# Patient Record
Sex: Female | Born: 1970 | Race: White | Hispanic: Yes | Marital: Single | Smoking: Never smoker
Health system: Southern US, Community
[De-identification: ages and names within clinical notes are randomized; demographics above are authoritative.]

## PROBLEM LIST (undated history)

## (undated) DIAGNOSIS — E782 Mixed hyperlipidemia: Secondary | ICD-10-CM

## (undated) DIAGNOSIS — F419 Anxiety disorder, unspecified: Secondary | ICD-10-CM

## (undated) HISTORY — DX: Anxiety disorder, unspecified: F41.9

## (undated) HISTORY — DX: Mixed hyperlipidemia: E78.2

## (undated) HISTORY — PX: COLPOSCOPY: SHX161

## (undated) HISTORY — PX: CERVICAL BIOPSY  W/ LOOP ELECTRODE EXCISION: SUR135

---

## 1998-09-18 ENCOUNTER — Ambulatory Visit (HOSPITAL_COMMUNITY): Admission: RE | Admit: 1998-09-18 | Discharge: 1998-09-18 | Payer: Self-pay | Admitting: *Deleted

## 1999-01-10 ENCOUNTER — Inpatient Hospital Stay (HOSPITAL_COMMUNITY): Admission: RE | Admit: 1999-01-10 | Discharge: 1999-01-10 | Payer: Self-pay | Admitting: *Deleted

## 1999-01-16 ENCOUNTER — Encounter (HOSPITAL_COMMUNITY): Admission: RE | Admit: 1999-01-16 | Discharge: 1999-02-04 | Payer: Self-pay | Admitting: Obstetrics & Gynecology

## 1999-01-16 ENCOUNTER — Encounter: Payer: Self-pay | Admitting: Obstetrics & Gynecology

## 1999-02-02 ENCOUNTER — Inpatient Hospital Stay (HOSPITAL_COMMUNITY): Admission: AD | Admit: 1999-02-02 | Discharge: 1999-02-02 | Payer: Self-pay | Admitting: *Deleted

## 1999-02-04 ENCOUNTER — Inpatient Hospital Stay (HOSPITAL_COMMUNITY): Admission: AD | Admit: 1999-02-04 | Discharge: 1999-02-05 | Payer: Self-pay | Admitting: *Deleted

## 2003-07-04 ENCOUNTER — Ambulatory Visit (HOSPITAL_COMMUNITY): Admission: RE | Admit: 2003-07-04 | Discharge: 2003-07-04 | Payer: Self-pay | Admitting: *Deleted

## 2003-09-07 ENCOUNTER — Inpatient Hospital Stay (HOSPITAL_COMMUNITY): Admission: AD | Admit: 2003-09-07 | Discharge: 2003-09-09 | Payer: Self-pay | Admitting: Family Medicine

## 2005-04-22 ENCOUNTER — Encounter: Admission: RE | Admit: 2005-04-22 | Discharge: 2005-04-22 | Payer: Self-pay | Admitting: *Deleted

## 2010-07-28 ENCOUNTER — Other Ambulatory Visit: Payer: Self-pay | Admitting: Obstetrics & Gynecology

## 2010-07-28 ENCOUNTER — Other Ambulatory Visit: Payer: Self-pay | Admitting: Family Medicine

## 2010-07-28 DIAGNOSIS — Z1231 Encounter for screening mammogram for malignant neoplasm of breast: Secondary | ICD-10-CM

## 2010-08-12 ENCOUNTER — Ambulatory Visit (HOSPITAL_COMMUNITY): Admission: RE | Admit: 2010-08-12 | Payer: Self-pay | Source: Ambulatory Visit

## 2011-07-02 ENCOUNTER — Other Ambulatory Visit (HOSPITAL_COMMUNITY): Payer: Self-pay | Admitting: Physician Assistant

## 2011-07-02 DIAGNOSIS — Z1231 Encounter for screening mammogram for malignant neoplasm of breast: Secondary | ICD-10-CM

## 2011-07-08 ENCOUNTER — Ambulatory Visit (INDEPENDENT_AMBULATORY_CARE_PROVIDER_SITE_OTHER): Payer: Self-pay | Admitting: Obstetrics & Gynecology

## 2011-07-08 ENCOUNTER — Other Ambulatory Visit: Payer: Self-pay | Admitting: Obstetrics & Gynecology

## 2011-07-08 ENCOUNTER — Encounter: Payer: Self-pay | Admitting: Obstetrics & Gynecology

## 2011-07-08 VITALS — BP 109/67 | HR 72 | Temp 97.1°F | Ht 60.0 in | Wt 127.6 lb

## 2011-07-08 DIAGNOSIS — R102 Pelvic and perineal pain: Secondary | ICD-10-CM

## 2011-07-08 DIAGNOSIS — N946 Dysmenorrhea, unspecified: Secondary | ICD-10-CM | POA: Insufficient documentation

## 2011-07-08 NOTE — Progress Notes (Signed)
  Subjective:    Patient ID: Brittany Huber, female    DOB: Jun 27, 1970, 41 y.o.   MRN: [id number]  HPIPatient's last menstrual period was 07/02/2011. [id number] On Yasmin for the last 2 years, still has dysmenorrhea and menstrual headaches. Advil helps. Referred by [facility]. Chart states that uterus is 6-8 weeks size.  History reviewed. No pertinent past medical history. History reviewed. No pertinent past surgical history. Scheduled Meds:   Continuous Infusions:   PRN Meds:.   Current outpatient prescriptions:drospirenone-ethinyl estradiol (YAZ,GIANVI,LORYNA) 3-0.02 MG tablet, Take 1 tablet by mouth daily., Disp: , Rfl:   Review of Systems No Known Allergies History   Social History  . Marital Status: Single    Spouse Name: N/A    Number of Children: N/A  . Years of Education: N/A   Occupational History  . Not on file.   Social History Main Topics  . Smoking status: Never Smoker   . Smokeless tobacco: Never Used  . Alcohol Use: No  . Drug Use: No  . Sexually Active: Yes    Birth Control/ Protection: Pill   Other Topics Concern  . Not on file   Social History Narrative  . No narrative on file   History reviewed. No pertinent family history.     Objective:   Physical Exam  Constitutional: She appears well-developed. No distress.  Abdominal: Soft. She exhibits no distension. There is no tenderness.  Genitourinary: Vagina normal and uterus normal.       No mass   Filed Vitals:   07/08/11 1259  BP: 109/67  Pulse: 72  Temp: 97.1 F (36.2 C)  TempSrc: Oral  Height: 5' (1.524 m)  Weight: 57.879 kg (127 lb 9.6 oz)   NAD       Assessment & Plan:  Dysmenorrhea, headaches. Continuous OCP use for 4 packs consecutively. Pelvic US, RTC 4 months.  Ugochi Henzler 1:52 PM 07/08/11

## 2011-07-08 NOTE — Patient Instructions (Signed)
Dysmenorrhea (Dysmenorrhea) La causa del dolor menstrual son las contracciones de los msculos del tero durante el perodo menstrual. Los msculos del tero se contraen debido a las sustancias qumicas que se encuentran en sus paredes. La dismenorrea primaria son clicos menstruales que duran algunos das, al comienzo del perodo menstrual o poco despus. Ocurren cuando la adolescente comienza a tener sus perodos. A medida que la mujer madura, o tiene un beb, los clicos disminuyen o desaparecen. La dismenorrea secundaria comienza en etapas posteriores de la vida, dura ms y el dolor puede ser ms intenso que en el caso de la dismenorrea primaria. El dolor puede comenzar antes del perodo y durar hasta algunos das despus de su finalizacin. Este tipo de dismenorrea generalmente se debe a un problema subyacente como:  El tejido que recubre internamente el tero se desarrolla fuera del mismo, en otras reas del organismo (endometriosis).   El tejido del endometrio, que generalmente recubre el tero, se desarrolla en las paredes musculares del tero (adenomiosis).   Los vasos sanguneos de la pelvis se llenan de sangre justo antes del perodo menstrual (sndrome congestivo plvico).   Desarrollo excesivo de algunas clulas del tejido que cubre el tero o el cuello del tero (plipos del tero o del cuello).   Cada del tero (prolapso) debido al aflojamiento y distensin de los ligamentos.   Depresin.   Problemas en la vejiga como infeccin o inflamacin.   Problemas intestinales, como tumores o sndrome de colon irritable.   Cncer en los rganos femeninos o en la vejiga.   tero excesivamente inclinado.   Apertura del cuello del tero muy estrecha o cerrada.   Tumores no cancerosos del tero (fibromas).   Enfermedad plvica inflamatoria (EPI).   Cicatrices en la pelvis (adherencias) por cirugas previas.   Quiste ovrico.   Uso del dispositivo intrauterino (DIU) para el  control de la natalidad.  CAUSAS La causa del dolor menstrual con frecuencia es desconocida. SNTOMAS  Clicos o dolor punzante en la zona inferior del abdomen.   En algunos casos, tambin se experimentan cefaleas.   Dolor en la zona inferior de la espalda.   Ganas de vomitar (nuseas ) o vmitos.   Diarrea.   Sudoracin o mareos.  DIAGNSTICO El diagnstico se basa en la historia personal, los sntomas, el examen fsico, pruebas diagnsticas o procedimientos. Las pruebas diagnsticas o procedimientos pueden incluir:  Anlisis de sangre.   Ecografa.   Un estudio de las capas que cubren el tero (dilatacin y curetaje, D y C).   Examen de la zona interna del abdomen o la pelvis con un laparoscopio.   Radiografas.   Tomografa computada.   Resonancia magntica.   Un examen del interior de la vejiga con un citoscopio.   Examen del interior del intestino o el estmago con un colonoscopio o un gastroscopio.  TRATAMIENTO El tratamiento depende de la causa de la dismenorrea. El tratamiento puede incluir:  Analgsicos prescriptos por el mdico.   Pldoras anticonceptivas.   Terapia de hormonas.   Medicamentos antiinflamatorios no esteroides. Ayudan a detener la produccin de prostaglandinas.   Un dispositivo intrauterino que contenga progesterona.   Acupuntura.   Ciruga para extirpar adherencias, endometriosis, quistes ovricos o fibromas.   Remocin del tero (histerectoma).   Inyecciones de progesterona para interrumpir el periodo menstrual.   Diseccin de los nervios del sacro que van a los rganos femeninos (neurectoma presacra).   Corriente elctrica a los nervios sacros (estimulacin del nervio sacro).   Antidepresivos.     Terapia psiquitrica, psicoterapia o terapia grupal.   La actividad fsica y la fisioterapia.   La meditacin y el yoga.  INSTRUCCIONES PARA EL CUIDADO DOMICILIARIO  Utilice los medicamentos de venta libre o de prescripcin  para Chief Technology Officer, el malestar o la [place], segn se lo indique el profesional que lo asiste.   Coloque una almohadilla elctrica o una botella con agua caliente en la zona inferior del abdomen. No duerma con la almohadilla trmica.   Los ejercicios aerbicos, caminatas, natacin, Scientist, research (physical sciences) ayudan a [employer].   Masajear la zona inferior de la espalda o el abdomen puede ser de [place].   Dejar de fumar.   Evite el alcohol y la cafena.   El yoga, la meditacin o la acupuntura pueden ser de [place].  SOLICITE ATENCIN MDICA SI:  El dolor no mejora con los medicamentos.   Tiene dolor durante las [employer].  SOLICITE ATENCIN MDICA DE INMEDIATO SI:  El dolor aumenta y no consigue aliviarlo con los medicamentos.   Tiene fiebre.   Comienza a sentir nuseas o vomita durante el perodo y no puede controlarlo con medicamentos.   Presenta una hemorragia vaginal anormal durante el perodo.   Se desmaya.  EST SEGURO QUE:  Comprende las instrucciones para el alta mdica.   Controlar su enfermedad.   Solicitar atencin mdica de inmediato segn las indicaciones.  Document Released: 01/21/2005 Document Revised: 04/02/2011 [facility] Patient Information 2012 [place], [place]. Take your birth control pill without the last 7 pills for the next 4 packs.   Marcie Bal MD

## 2011-07-10 ENCOUNTER — Ambulatory Visit (HOSPITAL_COMMUNITY)
Admission: RE | Admit: 2011-07-10 | Discharge: 2011-07-10 | Disposition: A | Discharge status: Home / Self Care | Payer: Self-pay | Source: Ambulatory Visit | Attending: Obstetrics & Gynecology | Admitting: Obstetrics & Gynecology

## 2011-07-10 DIAGNOSIS — R102 Pelvic and perineal pain: Secondary | ICD-10-CM

## 2011-07-10 DIAGNOSIS — N949 Unspecified condition associated with female genital organs and menstrual cycle: Secondary | ICD-10-CM | POA: Insufficient documentation

## 2011-07-10 DIAGNOSIS — N946 Dysmenorrhea, unspecified: Secondary | ICD-10-CM | POA: Insufficient documentation

## 2011-07-24 ENCOUNTER — Ambulatory Visit (HOSPITAL_COMMUNITY)
Admission: RE | Admit: 2011-07-24 | Discharge: 2011-07-24 | Disposition: A | Discharge status: Home / Self Care | Payer: Self-pay | Source: Ambulatory Visit | Attending: Physician Assistant | Admitting: Physician Assistant

## 2011-07-24 DIAGNOSIS — Z1231 Encounter for screening mammogram for malignant neoplasm of breast: Secondary | ICD-10-CM

## 2011-07-28 ENCOUNTER — Other Ambulatory Visit: Payer: Self-pay | Admitting: Physician Assistant

## 2011-07-28 DIAGNOSIS — R928 Other abnormal and inconclusive findings on diagnostic imaging of breast: Secondary | ICD-10-CM

## 2011-08-25 ENCOUNTER — Ambulatory Visit (INDEPENDENT_AMBULATORY_CARE_PROVIDER_SITE_OTHER): Payer: Self-pay | Admitting: *Deleted

## 2011-08-25 VITALS — BP 120/68 | HR 61 | Temp 97.5°F | Ht 60.5 in | Wt 125.4 lb

## 2011-08-25 DIAGNOSIS — Z01419 Encounter for gynecological examination (general) (routine) without abnormal findings: Secondary | ICD-10-CM

## 2011-08-25 NOTE — Progress Notes (Signed)
Referred from the [facility] for additional imaging of the left breast. Screening mammogram 07/24/11.  Pap Smear:    Pap smear completed today. Patients last Pap smear was 11/05/09 at the [facility] and normal. Patient has a history of an abnormal Pap smear in 2007 that required a colposcopy. No Pap smear results in EPIC. Will have result scanned into EPIC.  Physical exam: Breasts Breasts symmetrical. No skin abnormalities bilateral breasts. No nipple retraction bilateral breasts. No nipple discharge bilateral breasts. No lymphadenopathy. No lumps palpated bilateral breasts. Patient complained of left breast pain that hurts all the time. Patient rated pain at a 8 out of 10. Patient referred to the [facility] for left breast diagnostic mammogram and possible left breast ultrasound. Appointment scheduled for Thursday, Aug 27, 2011 at 1030.                Pelvic/Bimanual Ext Genitalia No lesions, no swelling and no discharge observed on external genitalia.         Vagina Vagina pink and normal texture. No lesions and menstrual cycle bleeding observed in vagina.          Cervix Cervix is present. Cervix pink and of normal texture. Menstrual cycle bleeding observed on cervix.     Uterus Uterus is present and palpable. Uterus in normal position and normal size.        Adnexae Bilateral ovaries present and palpable. No tenderness on palpation.          Rectovaginal No rectal exam completed today since patient had no rectal complaints. No skin abnormalities observed on exam.

## 2011-08-25 NOTE — Patient Instructions (Addendum)
Taught patient how to perform BSE and gave educational materials to take home. Let her know [facility] will cover Pap smears every 3 years unless has a history of abnormal Pap smears. Patient referred to the [facility] for left breast diagnostic mammogram and possible left breast ultrasound. Appointment scheduled for Thursday, Aug 27, 2011 at 1030. Patient aware of appointment and will be there. Let patient know will follow up with her within the next couple weeks with results. Patient verbalized understanding.

## 2011-08-27 ENCOUNTER — Other Ambulatory Visit: Payer: Self-pay

## 2011-09-03 ENCOUNTER — Ambulatory Visit
Admission: RE | Admit: 2011-09-03 | Discharge: 2011-09-03 | Disposition: A | Discharge status: Home / Self Care | Payer: No Typology Code available for payment source | Source: Ambulatory Visit | Attending: Physician Assistant | Admitting: Physician Assistant

## 2011-09-03 DIAGNOSIS — R928 Other abnormal and inconclusive findings on diagnostic imaging of breast: Secondary | ICD-10-CM

## 2011-09-08 ENCOUNTER — Telehealth: Payer: Self-pay

## 2011-09-08 NOTE — Telephone Encounter (Signed)
Called pt with Spanish interpreter, Genella Rife, and informed pt of normal pap results.  She was also aware of the negative breast diagnostic mammo and that they recommended routine screening yearly back on schedule.  Pt had no further questions.

## 2012-06-16 ENCOUNTER — Other Ambulatory Visit: Payer: Self-pay | Admitting: Physician Assistant

## 2012-10-07 ENCOUNTER — Other Ambulatory Visit (HOSPITAL_COMMUNITY): Payer: Self-pay | Admitting: Nurse Practitioner

## 2012-10-07 DIAGNOSIS — Z1231 Encounter for screening mammogram for malignant neoplasm of breast: Secondary | ICD-10-CM

## 2012-10-21 ENCOUNTER — Ambulatory Visit (HOSPITAL_COMMUNITY)
Admission: RE | Admit: 2012-10-21 | Discharge: 2012-10-21 | Disposition: A | Discharge status: Home / Self Care | Payer: Self-pay | Source: Ambulatory Visit | Attending: Nurse Practitioner | Admitting: Nurse Practitioner

## 2012-10-21 DIAGNOSIS — Z1231 Encounter for screening mammogram for malignant neoplasm of breast: Secondary | ICD-10-CM

## 2013-04-27 IMAGING — US US PELVIS COMPLETE
1 series · 13 of 25 positions shown · non-contrast
Comparison: None.

CLINICAL DATA: Pelvic pain.  Dysmenorrhea with headaches.  On
Osmani birth control. LMP 07/02/2011

TRANSABDOMINAL AND TRANSVAGINAL ULTRASOUND OF PELVIS
TECHNIQUE: Both transabdominal and transvaginal ultrasound
examinations of the pelvis were performed. Transabdominal technique
was performed for global imaging of the pelvis including uterus,
ovaries, adnexal regions, and pelvic cul-de-sac.

[Series 1: us transvaginal non-ob · 13 of 61 slices shown]
[im 1/61]
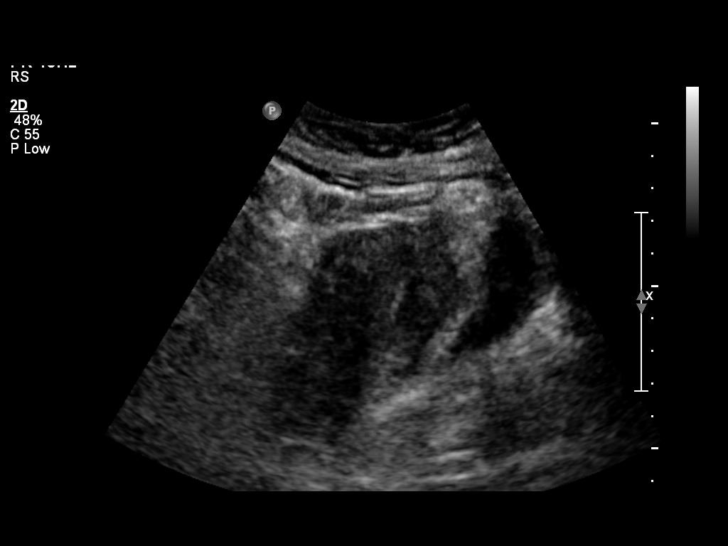
[im 6/61]
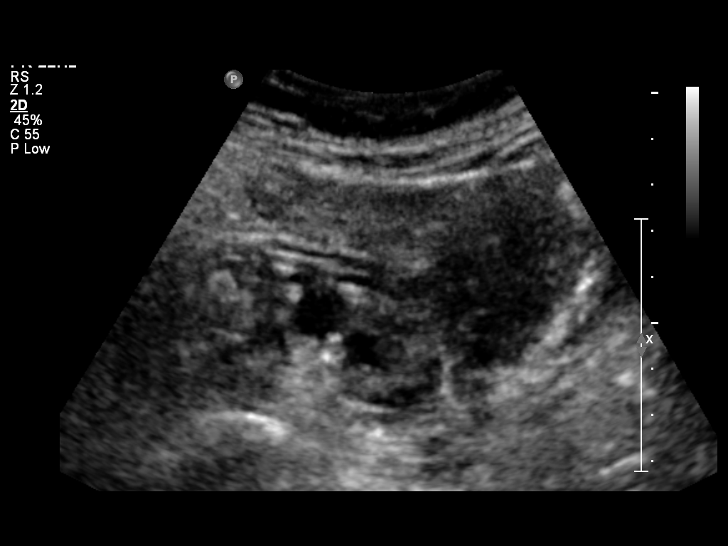
[im 11/61]
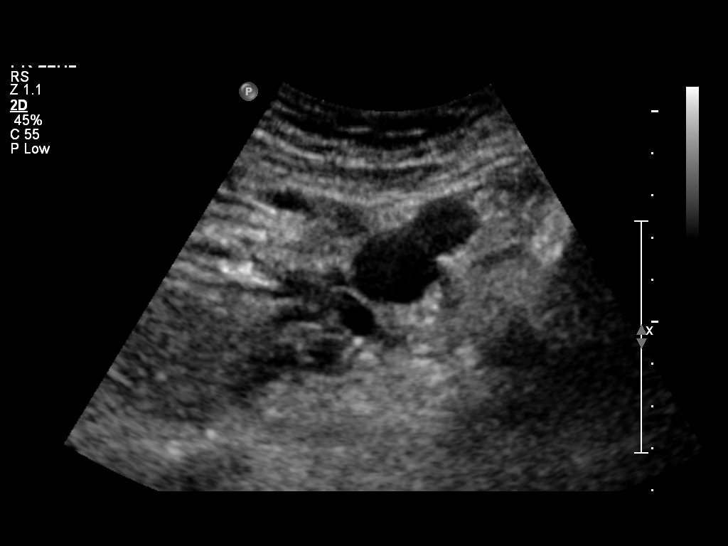
[im 16/61]
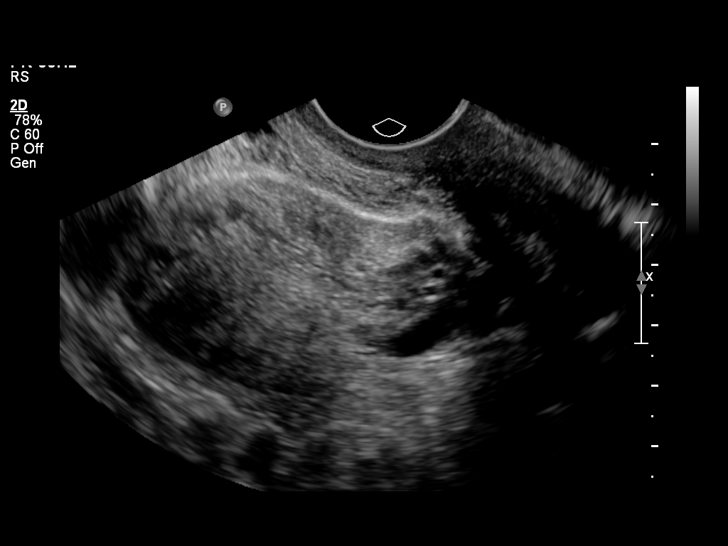
[im 21/61]
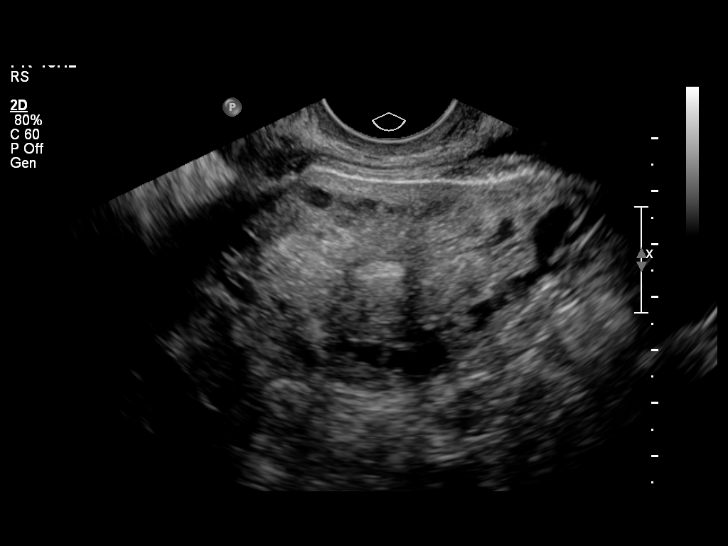
[im 26/61]
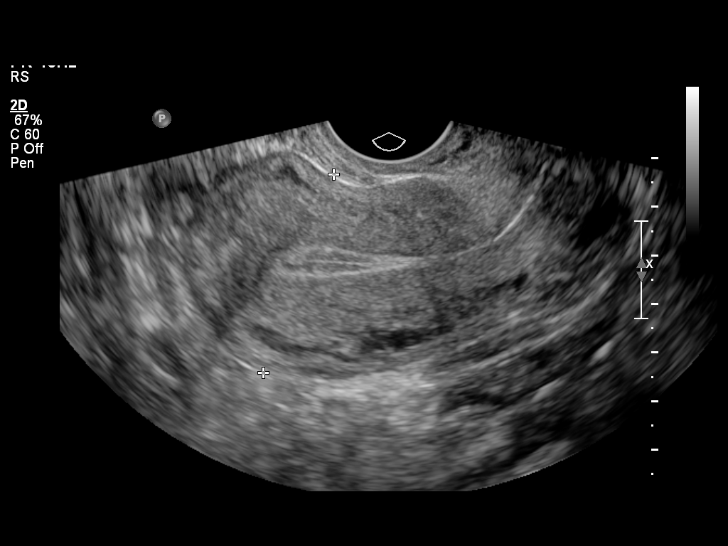
[im 31/61]
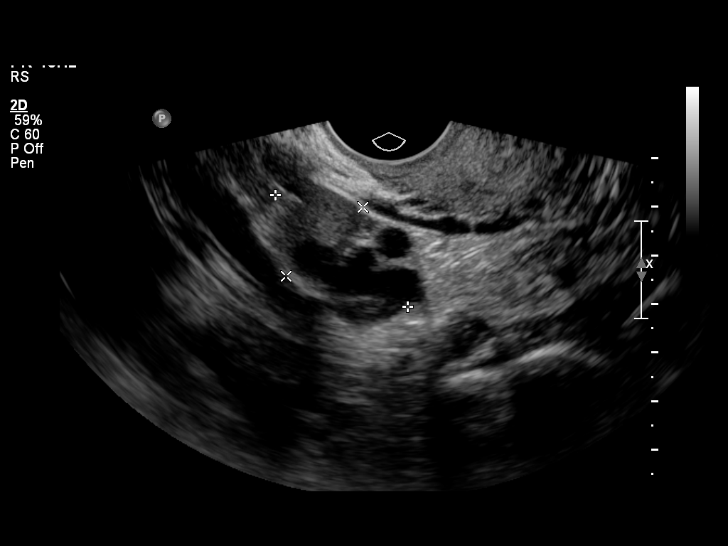
[im 36/61]
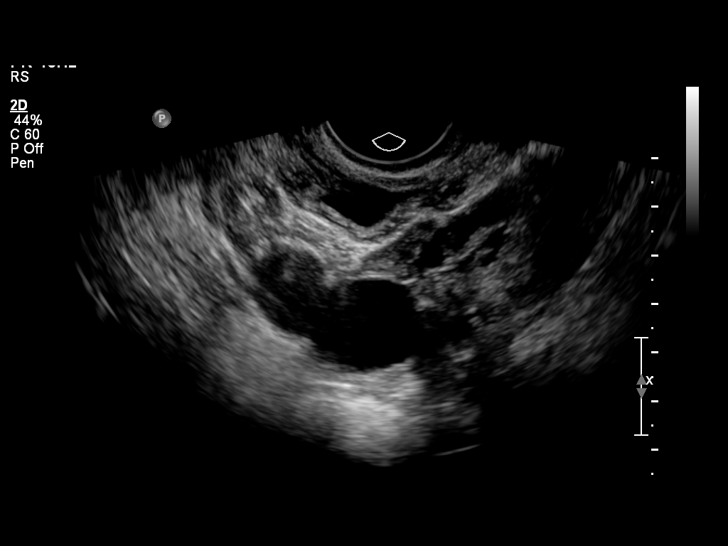
[im 41/61]
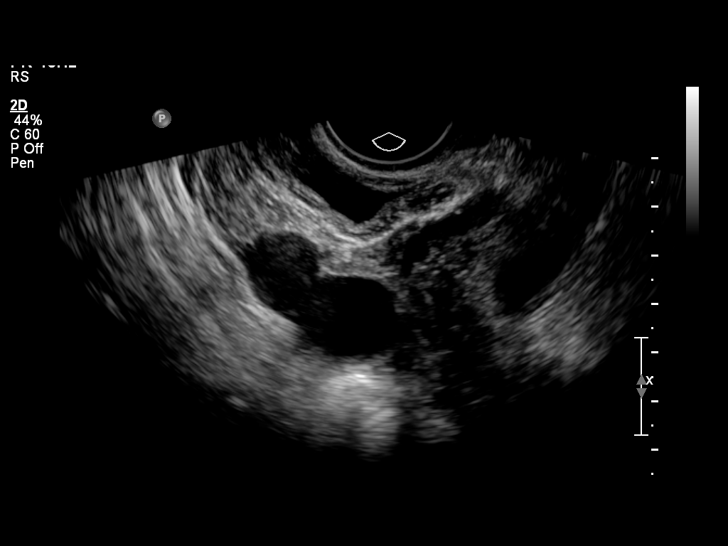
[im 46/61]
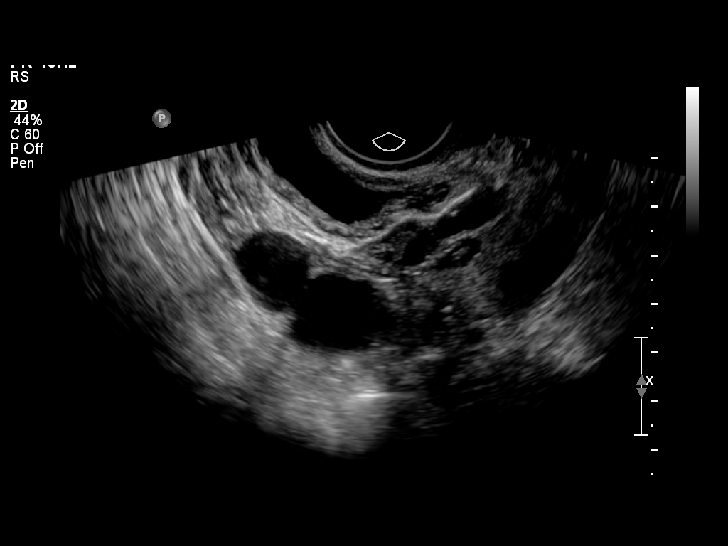
[im 51/61]
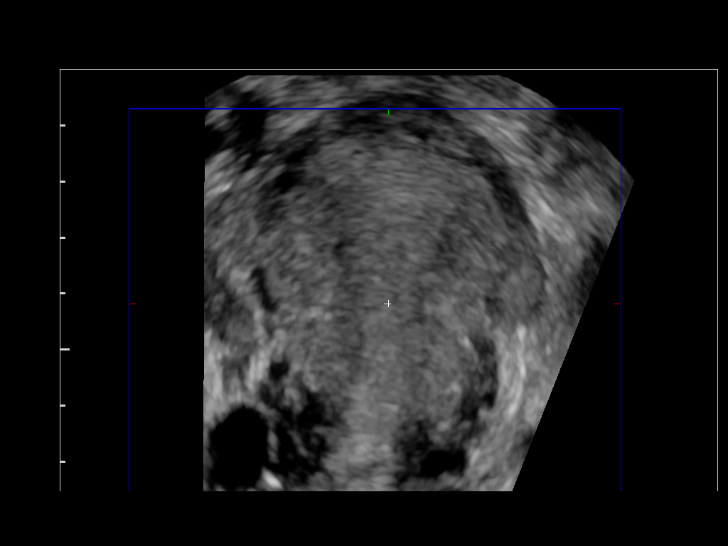
[im 56/61]
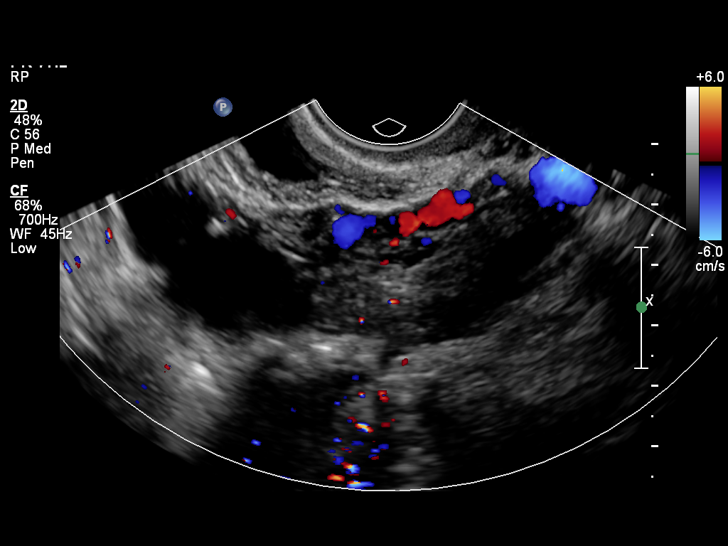
[im 61/61]
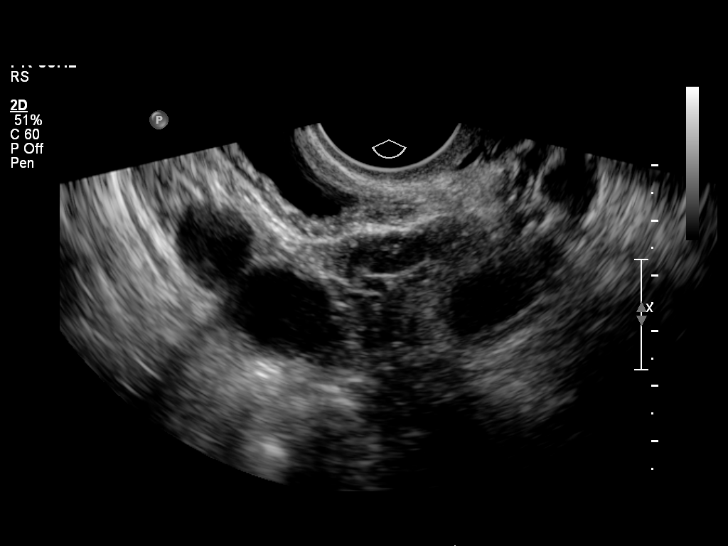

[13 of 25 positions shown; findings below may reference images not displayed]

It was necessary to proceed with endovaginal exam following the
transabdominal exam to visualize the myometrium, endometrium and
adnexa.
FINDINGS: Uterus: Demonstrates a sagittal length of 9.1 cm, depth of 4.3 cm
and width of 6.1 cm.  A homogeneous myometrium is seen

Endometrium: Has an early tri- layered appearance with an AP width
of 7.0 mm.  No areas of focal thickening or heterogeneity are seen
and this would correlate with an early proliferative endometrial
stripe and correspond with the patient's given LMP of 07/02/2011.

Right ovary:  Has a normal appearance measuring 2.4 x 3.6 x 2.1 cm

Left ovary: Has a normal appearance measuring 2.3 x 2.6 by 3.6 cm

Other findings: No pelvic fluid or separate adnexal masses are
seen.
IMPRESSION: Normal early proliferative pelvic ultrasound.

## 2013-07-25 ENCOUNTER — Emergency Department (HOSPITAL_COMMUNITY)
Admission: EM | Admit: 2013-07-25 | Discharge: 2013-07-25 | Disposition: A | Discharge status: Home / Self Care | Payer: Self-pay | Attending: Emergency Medicine | Admitting: Emergency Medicine

## 2013-07-25 ENCOUNTER — Encounter (HOSPITAL_COMMUNITY): Payer: Self-pay | Admitting: Emergency Medicine

## 2013-07-25 ENCOUNTER — Emergency Department (HOSPITAL_COMMUNITY): Payer: Self-pay

## 2013-07-25 DIAGNOSIS — Z79899 Other long term (current) drug therapy: Secondary | ICD-10-CM | POA: Insufficient documentation

## 2013-07-25 DIAGNOSIS — G43909 Migraine, unspecified, not intractable, without status migrainosus: Secondary | ICD-10-CM | POA: Insufficient documentation

## 2013-07-25 DIAGNOSIS — IMO0002 Reserved for concepts with insufficient information to code with codable children: Secondary | ICD-10-CM | POA: Insufficient documentation

## 2013-07-25 MED ORDER — PROCHLORPERAZINE EDISYLATE 5 MG/ML IJ SOLN
10.0000 mg | Freq: Once | INTRAMUSCULAR | Status: AC
  Administered 2013-07-25: 10 mg via INTRAVENOUS
  Filled 2013-07-25: qty 2

## 2013-07-25 MED ORDER — KETOROLAC TROMETHAMINE 30 MG/ML IJ SOLN
30.0000 mg | Freq: Once | INTRAMUSCULAR | Status: AC
  Administered 2013-07-25: 30 mg via INTRAVENOUS
  Filled 2013-07-25: qty 1

## 2013-07-25 MED ORDER — DIPHENHYDRAMINE HCL 50 MG/ML IJ SOLN
25.0000 mg | Freq: Once | INTRAMUSCULAR | Status: AC
  Administered 2013-07-25: 25 mg via INTRAVENOUS
  Filled 2013-07-25: qty 1

## 2013-07-25 NOTE — ED Notes (Signed)
Pt sts she is also having a throbbing feeling to right side of neck when she gets a HA.

## 2013-07-25 NOTE — ED Provider Notes (Signed)
CSN: [id number]     Arrival date & time 07/25/13  1619 History   First MD Initiated Contact with Patient 07/25/13 1958     Chief Complaint  Patient presents with  . Headache    Patient is a 43 y.o. female presenting with headaches.  Headache Pain location:  L temporal and R temporal Quality:  Dull Radiates to:  Does not radiate Severity currently:  1/10 Severity at highest:  6/10 Onset quality:  Gradual Duration:  2 weeks Timing:  Intermittent Progression:  Improving Chronicity:  Recurrent Similar to prior headaches: yes   Context comment:  Menstruation Relieved by:  Nothing Worsened by:  Light and sound Ineffective treatments:  None tried Associated symptoms: photophobia   Associated symptoms: no abdominal pain, no blurred vision, no congestion, no cough, no diarrhea, no pain, no facial pain, no fever, no loss of balance, no nausea, no neck pain, no neck stiffness, no numbness, no paresthesias, no sore throat, no syncope, no tingling, no URI, no visual change, no vomiting and no weakness    She states that she has a long history of migraine headaches. She states that she always gets headaches around the time of her menstrual period onset. She states that this headache started with her menstrual period however unlike usual it did not improve over the course of first one to 2 days. Says that time she's had daily headaches. Her headaches have been moderate in severity except for on the first day, when it was severe.  It was gradual in onset, and otherwise identical to prior headaches. Currently she states that she has almost no headache; it has been improving over the past 24 hours.  The triage nurse documented that she reported facial droop. I question the patient with Spanish interpreter via telephone and the patient denies any facial droop. She also denies any visual changes, weakness, numbness, paresthesias, difficulty with speech or gait or balance. She also denies fever, confusion,  neck pain, neck stiffness.  History reviewed. No pertinent past medical history. History reviewed. No pertinent past surgical history. Family History  Problem Relation Age of Onset  . Cancer Maternal Grandmother     uterine cancer   History  Substance Use Topics  . Smoking status: Never Smoker   . Smokeless tobacco: Never Used  . Alcohol Use: No   OB History   Grav Para Term Preterm Abortions TAB SAB Ect Mult Living   5 4 4  0 1 1    3      Review of Systems  Constitutional: Negative for fever.  HENT: Negative for congestion and sore throat.   Eyes: Positive for photophobia. Negative for blurred vision and pain.  Respiratory: Negative for cough.   Cardiovascular: Negative for syncope.  Gastrointestinal: Negative for nausea, vomiting, abdominal pain and diarrhea.  Musculoskeletal: Negative for neck pain and neck stiffness.  Neurological: Positive for headaches. Negative for numbness, paresthesias and loss of balance.      Allergies  Review of patient's allergies indicates no known allergies.  Home Medications   Current Outpatient Rx  Name  Route  Sig  Dispense  Refill  . acyclovir (ZOVIRAX) 400 MG tablet   Oral   Take 400 mg by mouth 4 (four) times daily.         Marland Kitchen FLUoxetine (PROZAC) 20 MG tablet   Oral   Take 20 mg by mouth daily.         . Homeopathic Products (EARACHE DROPS) SOLN   Otic  Place 4 drops in ear(s) daily as needed (for earache).         . pravastatin (PRAVACHOL) 20 MG tablet   Oral   Take 20 mg by mouth daily.         . predniSONE (DELTASONE) 10 MG tablet   Oral   Take 10-30 mg by mouth daily with breakfast.          BP 133/68  Pulse 68  Temp(Src) 98.8 F (37.1 C) (Oral)  Resp 14  SpO2 99% Physical Exam  Nursing note and vitals reviewed. Constitutional: She appears well-developed and well-nourished. No distress.  HENT:  Head: Normocephalic and atraumatic.  Mouth/Throat: No oropharyngeal exudate.  Eyes: Conjunctivae are  normal. Pupils are equal, round, and reactive to light. No scleral icterus.  Neck: Normal range of motion. Neck supple. No Brudzinski's sign and no Kernig's sign noted.  Cardiovascular: Normal rate, regular rhythm, normal heart sounds and intact distal pulses.  Exam reveals no gallop and no friction rub.   No murmur heard. Pulmonary/Chest: Effort normal and breath sounds normal. No respiratory distress. She has no wheezes. She has no rales. She exhibits no tenderness.  Abdominal: Soft. Bowel sounds are normal. She exhibits no distension. There is no tenderness. There is no rebound and no guarding.  Musculoskeletal: She exhibits no edema.  Neurological:  Alert, oriented X 4, GCS 15.  CN 2-12 intact.  5/5 strength in bilateral upper and lower extremities, all major muscle groups.  Normal sensation to light touch in bilateral upper and lower extremities.  Visual fields intact to confrontation.  Normal gait.  Negative Romberg.  Normal coordination.  Normal finger to nose and heel to shin.   Skin: Skin is warm and dry. No rash noted.  Psychiatric: She has a normal mood and affect.    ED Course  Procedures (including critical care time) Labs Review Labs Reviewed - No data to display Imaging Review Ct Head Wo Contrast  07/25/2013   CLINICAL DATA:  Headache for 2 weeks, history of migraines, patient describes prior stroke  EXAM: CT HEAD WITHOUT CONTRAST  TECHNIQUE: Contiguous axial images were obtained from the base of the skull through the vertex without intravenous contrast.  COMPARISON:  None  FINDINGS: No evidence of hemorrhage, infarct, or extra-axial fluid. Normal sulcation. No hydrocephalus. There is mild incidental asymmetry in the temporal horns of the ventricles. Calvarium is intact. No evidence of skull fracture. No significant inflammatory change in the sinuses that are visualized. Incidental note of a punctate parenchymal calcification right parietal lobe above the level of the ventricles,  likely not of acute clinical significance.  IMPRESSION: No acute abnormalities   Electronically Signed   By: Skipper Cliche M.D.   On: 07/25/2013 21:02     EKG Interpretation None      MDM   43 year old female with a history of migraine headaches presents complaining of a headache. Her headache is gradual in onset, has been ongoing daily for about 2 weeks, started around the time of her menstrual period, and is otherwise very similar to headaches she said in the past. It is moderate in severity at worst, currently improving, mild. She's had no fever, altered bowel status, neck pain or stiffness, or neurological symptoms. Her headache is migrainous in nature with, throbbing, temporal, with photophobia and phonophobia.  I first spoke to the patient utilizing her son as an interpreter.  He mentioned some strange, non-neurologic symptoms such as bilateral face numbness, bilateral face droop, sometimes on one  side, sometimes on the other, and arm "tingling."  I was called into another room to see an unstable patient.  Based on this conversation, I ordered I head CT.  I went back and spoke with the patient shortly thereafter with a Spanish interpreter via telephone.  Her symptoms based on this conversation are as detailed above.  Very migrainous in nature, similar to recurrent, chronic headaches.  At this time, she denied facial droop, difficulty with speech, weakness, numbness, paresthesias, difficulty with gait or balance, or other neurological symptoms.  Her neuro exam is non-focal as documented.  She is treated in the emergency department with a migraine cocktail. On reevaluation, the patient is sleeping. While over up she states that her headache is gone and she feels much better. She is discharged home with ED return precautions and is to follow up with her primary care physician to discuss her headaches.   Final diagnoses:  Migraine       Wendall Papa, MD 07/26/13 0006

## 2013-07-25 NOTE — ED Notes (Signed)
Pharmacy tech at bedside 

## 2013-07-25 NOTE — ED Notes (Addendum)
Pt c/o HA that started last week on Thursday. sts she went to PCP and was given medicine to take for HA but it isn't giving her any relief. sts initially she felt like her mouth was dropping but hasn't had it since Thursday. Pt put on prednisone and anti-anxiety meds. Was told she had an infection in her ear. Pt denies blurred vision currently but takes she had a few moments yesterday when she was having some blurred vision. Denies n/v. Nad, skin warm and dry, resp e/u. No facial droop, no slurred speech, equal hand grips. No difficulty ambulating to room.

## 2013-07-25 NOTE — ED Notes (Signed)
Pt reports headache x 2 weeks, had episodes over the past week with facial droop. No droop noted at triage, grips equal, no arm drift. bp 114/58 at triage. Denies n/v.

## 2013-07-25 NOTE — Discharge Instructions (Signed)
Cefalea migrañosa  (Migraine Headache)  Una cefalea migrañosa es un dolor intenso y punzante en uno o ambos lados de la cabeza. La migraña puede durar desde 30 minutos hasta varias horas.  CAUSAS   No siempre se conoce la causa exacta de la cefalea migrañosa. Sin embargo, pueden aparecer cuando los nervios del cerebro se irritan y liberan ciertas sustancias químicas que causan inflamación. Esto ocasiona dolor.  Existen también ciertos factores que pueden desencadenar las migrañas, como los siguientes:  · Alcohol.  · Fumar.  · Estrés.  · La menstruación  · Quesos añejados.  · Los alimentos o las bebidas que contienen nitratos, glutamato, aspartamo o tiramina.  · Falta de sueño.  · Chocolate.  · Cafeína.  · Hambre.  · Actividad física extenuante.  · Fatiga.  · Medicamentos que se usan para tratar el dolor en el pecho (nitroglicerina), píldoras anticonceptivas, estrógeno y algunos medicamentos para la hipertensión arterial.  SIGNOS Y SÍNTOMAS  · Dolor en uno o ambos lados de la cabeza.  · Dolor pulsante o punzante.  · Dolor intenso que impide realizar las actividades diarias.  · Dolor que se agrava por cualquier actividad física.  · Náuseas, vómitos o ambos.  · Mareos.  · Dolor con la exposición a las luces brillantes, a los ruidos fuertes o la actividad.  · Sensibilidad general a las luces brillantes, a los ruidos fuertes o a los olores.  Antes de sufrir una migraña, puede recibir señales de advertencia (aura). Un aura puede incluir:  · Ver las luces intermitentes.  · Ver puntos brillantes, halos o líneas en zigzag.  · Tener una visión en túnel o visión borrosa.  · Sensación de entumecimiento u hormigueo.  · Dificultad para hablar  · Debilidad muscular.  DIAGNÓSTICO   La cefalea migrañosa se diagnostica en función de lo siguiente:  · Síntomas.  · Examen físico.  · Una TC (tomografía computada) o resonancia magnética de la cabeza. Estas pruebas de diagnóstico por imagen no pueden diagnosticar las migrañas, pero pueden  ayudar a descartar otras causas de las cefaleas.  TRATAMIENTO  Le prescribirán medicamentos para aliviar el dolor y las náuseas. También podrán administrarse medicamentos para ayudar a prevenir las migrañas recurrentes.   INSTRUCCIONES PARA EL CUIDADO EN EL HOGAR  · Sólo tome medicamentos de venta libre o recetados para calmar el dolor o el malestar, según las indicaciones de su médico. No se recomienda usar los opiáceos a largo plazo.  · Cuando tenga la migraña, acuéstese en un cuarto oscuro y tranquilo  · Lleve un registro diario para averiguar lo que puede provocar las cefaleas migrañosas. Por ejemplo, escriba:  · Lo que usted come y bebe.  · Cuánto tiempo duerme.  · Algún cambio en su dieta o en los medicamentos.  · Limite el consumo de bebidas alcohólicas.  · Si fuma, deje de hacerlo.  · Duerma entre 7 y 9 horas, o según las recomendaciones del médico.  · Limite el estrés.  · Mantenga las luces tenues si le molestan las luces brillantes y la migraña empeora.  SOLICITE ATENCIÓN MÉDICA DE INMEDIATO SI:   · La migraña se hace cada vez más intensa.  · Tiene fiebre.  · Presenta rigidez en el cuello.  · Tiene pérdida de visión.  · Presenta debilidad muscular o pérdida del control muscular.  · Comienza a perder el equilibrio o tiene problemas para caminar.  · Sufre mareos o se desmaya.  · Tiene síntomas graves que son diferentes a los primeros 

## 2013-07-25 NOTE — ED Notes (Signed)
Pt returned from radiology.

## 2013-07-26 NOTE — ED Provider Notes (Signed)
I saw and evaluated the patient, reviewed the resident's note and I agree with the findings and plan.   EKG Interpretation None      Patient's HA sounds similar to chronic migraines. Treated symptomatically, no other concerning findings.  Ephraim Hamburger, MD 07/26/13 [phone]

## 2013-12-13 ENCOUNTER — Other Ambulatory Visit (HOSPITAL_COMMUNITY): Payer: Self-pay | Admitting: Nurse Practitioner

## 2013-12-13 DIAGNOSIS — Z1231 Encounter for screening mammogram for malignant neoplasm of breast: Secondary | ICD-10-CM

## 2013-12-22 ENCOUNTER — Ambulatory Visit (HOSPITAL_COMMUNITY)
Admission: RE | Admit: 2013-12-22 | Discharge: 2013-12-22 | Disposition: A | Discharge status: Home / Self Care | Payer: Self-pay | Source: Ambulatory Visit | Attending: Nurse Practitioner | Admitting: Nurse Practitioner

## 2013-12-22 DIAGNOSIS — Z1231 Encounter for screening mammogram for malignant neoplasm of breast: Secondary | ICD-10-CM

## 2013-12-25 ENCOUNTER — Other Ambulatory Visit: Payer: Self-pay | Admitting: Nurse Practitioner

## 2013-12-25 DIAGNOSIS — R928 Other abnormal and inconclusive findings on diagnostic imaging of breast: Secondary | ICD-10-CM

## 2014-01-04 ENCOUNTER — Other Ambulatory Visit: Payer: Self-pay | Admitting: Obstetrics and Gynecology

## 2014-01-04 DIAGNOSIS — R928 Other abnormal and inconclusive findings on diagnostic imaging of breast: Secondary | ICD-10-CM

## 2014-01-09 ENCOUNTER — Ambulatory Visit
Admission: RE | Admit: 2014-01-09 | Discharge: 2014-01-09 | Disposition: A | Discharge status: Home / Self Care | Payer: No Typology Code available for payment source | Source: Ambulatory Visit | Attending: Nurse Practitioner | Admitting: Nurse Practitioner

## 2014-01-09 ENCOUNTER — Encounter (HOSPITAL_COMMUNITY): Payer: Self-pay

## 2014-01-09 ENCOUNTER — Other Ambulatory Visit: Payer: Self-pay

## 2014-01-09 ENCOUNTER — Ambulatory Visit (HOSPITAL_COMMUNITY)
Admission: RE | Admit: 2014-01-09 | Discharge: 2014-01-09 | Disposition: A | Discharge status: Home / Self Care | Payer: No Typology Code available for payment source | Source: Ambulatory Visit | Attending: Obstetrics and Gynecology | Admitting: Obstetrics and Gynecology

## 2014-01-09 VITALS — BP 100/68 | Temp 98.5°F | Ht 62.0 in | Wt 125.4 lb

## 2014-01-09 DIAGNOSIS — R928 Other abnormal and inconclusive findings on diagnostic imaging of breast: Secondary | ICD-10-CM

## 2014-01-09 DIAGNOSIS — Z1239 Encounter for other screening for malignant neoplasm of breast: Secondary | ICD-10-CM

## 2014-01-09 NOTE — Patient Instructions (Signed)
Explained to  Brittany Huber that she did not need a Pap smear today due to last Pap smear was 08/25/2011.  Let her know BCCCP will cover Pap smears every 3 years unless has a history of abnormal Pap smears. Let patient know that her next Pap smear is due in April 2016. Referred patient to the [facility] for left breast diagnostic mammogram and possible ultrasound per recommendation. Appointment scheduled for Tuesday, January 09, 2014 at 1315. Patient aware of appointment and will be there.  Avelino Leeds Huber verbalized understanding.  Zekiah Coen, Arvil Chaco, RN 11:34 AM

## 2014-01-09 NOTE — Progress Notes (Signed)
No complaints today. Patient referred to [facility] by the [facility] due to recommending additional imaging of there left breast. Screening mammogram completed 12/22/2013 at [facility].  Pap Smear:  Pap smear not completed today. Last Pap smear was 08/25/2011 at [facility] and normal. Per patient has a history of an abnormal Pap smear in 2010 that required a Colposcopy and LEEP for follow-up. Per patient has had at least three normal Pap smears since LEEP. Last Pap smear result is in EPIC.  Physical exam: Breasts Breasts symmetrical. No skin abnormalities bilateral breasts. No nipple retraction bilateral breasts. No nipple discharge bilateral breasts. No lymphadenopathy. No lumps palpated bilateral breasts. Complaints of left center breast tenderness on exam. Referred patient to the [facility] for left breast diagnostic mammogram and possible ultrasound per recommendation. Appointment scheduled for Tuesday, January 09, 2014 at 1315.       Pelvic/Bimanual No Pap smear completed today since last Pap smear was 08/25/2011. Pap smear not indicated per BCCCP guidelines.

## 2014-01-18 ENCOUNTER — Telehealth: Payer: Self-pay

## 2014-01-18 NOTE — Telephone Encounter (Signed)
Called per interpreter to remind of [facility] appointment on 9/25 @ 10:15 AM.

## 2014-01-19 ENCOUNTER — Ambulatory Visit (HOSPITAL_BASED_OUTPATIENT_CLINIC_OR_DEPARTMENT_OTHER): Payer: Self-pay

## 2014-01-19 ENCOUNTER — Other Ambulatory Visit: Payer: Self-pay

## 2014-01-19 DIAGNOSIS — Z0000 Encounter for general adult medical examination without abnormal findings: Secondary | ICD-10-CM

## 2014-01-19 LAB — HEMOGLOBIN A1C
HEMOGLOBIN A1C: 5.7 % — AB (ref <5.7)
MEAN PLASMA GLUCOSE: 117 mg/dL — AB (ref <117)

## 2014-01-19 LAB — LIPID PANEL
Cholesterol: 206 mg/dL — ABNORMAL HIGH (ref 0–200)
HDL: 70 mg/dL (ref >39)
LDL CALC: 122 mg/dL — AB (ref 0–99)
Total CHOL/HDL Ratio: 2.9 Ratio
Triglycerides: 71 mg/dL (ref <150)
VLDL: 14 mg/dL (ref 0–40)

## 2014-01-19 LAB — GLUCOSE (CC13): GLUCOSE: 88 mg/dL (ref 70–140)

## 2014-01-19 NOTE — Patient Instructions (Signed)
Discussed health assessment with patient. Let patient know that will call her to follow up with labs and any programs the patient may need to reach healthy goals. Patient verbalized understanding.

## 2014-01-19 NOTE — Progress Notes (Signed)
Patient is a new patient to the [facility] program and is currently a BCCCP patient effective 01/09/2014.   Clinical Measurements: Patient is 4 ft. 10 3/4 inches, weight 126.7 lbs, waist circumference 32.5 inches, and hip circumference 38.5.5 inches.   Medical History: Patient has no history of high cholesterol or diabetes.Patient does not have a history of hypertension. Per patient no diagnosed history of coronary heart disease, heart attack, heart failure, stroke, vascular disease or congenital heart defects. Per has been diagnosed with TIA.  Blood Pressure, Self-measurement: Patient states that has no need to check Blood pressure.   Nutrition Assessment: Patient stated that eats 1 fruit every day. Patient states she eats one servings of vegetables a day. Per patient eats 3 or more ounces of whole grains daily. Patient doesn't eat two or more servings of fish weekly. Patient states she does drink more than 36 ounces or 450 calories of beverages with added sugars weekly. Patient stated she does watch her salt intake.   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  Smoking Status: Patient had never smoked and is not around smoke.  Quality of Life Assessment: In assessing patient's quality of life she stated that out of the past 30 days that she has felt her Physical health was good all but 4 days. Patient also stated that in the past 30 days that her mental health is not good including stress, depression and problems with emotions for 7 days. Patient did state that out of the past 30 days she felt her physical or mental health had not kept her from doing her usual activities including self-care, work or recreation.   Plan: Lab work will be done today including a lipid panel, blood glucose, and Hgb A1C. Will call lab results when they are finished.  Will look  at [place] or program that fits patient if needed.Marland Kitchen

## 2014-01-22 ENCOUNTER — Telehealth: Payer: Self-pay

## 2014-01-22 NOTE — Telephone Encounter (Signed)
Called to inform about lab work from 01/19/14. Interpreter informed patient: cholesterol- 206, HDL- 70, LDL- 122, triglycerides - 71, Bld Glucose -88 and HBG-A1C - 5.7.  Set up health Coaching for October 6 at 3 PM at cancer center for nutrition and activity using the [facility] Program.

## 2014-01-30 ENCOUNTER — Ambulatory Visit: Payer: Self-pay

## 2014-01-30 DIAGNOSIS — Z789 Other specified health status: Secondary | ICD-10-CM

## 2014-01-30 NOTE — Progress Notes (Signed)
Patient returns today for Health Coaching regarding Nutrition for her borderline AIC,cholesterol, LDL's and activity with an interpreter.   NUTRITION: Patient and I went over [place] Numbers again.Patient reviewed A1C handout to see about prediabetes, normal and  diabetes range. Patient went over what prediabetes is, complications that can result if do not get levels to normal, and diabetes level. Discussed increasing fiber in diet, reading labels and serving sizes.  Discussed watching carbohydrates, how to count them, serving sizes and number of carbs per day allowance. Used plastic serving sizes to see portion sizes and what were starches, fruit and vegetables. Patient went over 1,600 cal diet and we broke it down to the number of servings she could have. Patient received and reviewed the following handouts in Spanish: My Plate, Carb counting Menu, prediabetes, A1C Exam, [facility] Cookbook, Understanding your cholesterol numbers, and Make half your grains whole.  Gave patient measuring cup to measure serving sizes and demonstrated the serving sizes.  ACTIVITY: Discussed activity and walking Received and reviewed Exercise and Physical Activity [employer] book in [place].  PLAN: Increase amount of walking per day and add other exercises to plan, Decrease carbohydrates in diet. Lose weight. Follow up times three per phone unless needs and can call.

## 2014-01-30 NOTE — Patient Instructions (Signed)
Patient will follow 1600 calorie diet plan. Will review all handouts and exercise/activity book. Will increase exercise. Will measure portion sizes. Will call if has any questions. Will call patient in three weeks. Patient verbalized understanding.

## 2014-02-26 ENCOUNTER — Encounter (HOSPITAL_COMMUNITY): Payer: Self-pay

## 2014-07-10 ENCOUNTER — Telehealth (HOSPITAL_COMMUNITY): Payer: Self-pay | Admitting: *Deleted

## 2014-07-10 NOTE — Telephone Encounter (Signed)
Called patient with interpreter Gregary Signs. Scheduled patient to come to [facility] for a Pap smear. Appointment scheduled for 09/06/2014 at 0830. Patient verbalized understanding.

## 2014-09-03 ENCOUNTER — Telehealth: Payer: Self-pay

## 2014-09-04 NOTE — Telephone Encounter (Signed)
Called per Lavon Paganini interpreter. Patient stated that had changed her diet a lot. Discussed sweets and carbohydrates. Asked patient about exercise. Patient stated that was slowly adding in walking. (64m). Will call back in a couple weeks.

## 2014-09-06 ENCOUNTER — Encounter (HOSPITAL_COMMUNITY): Payer: Self-pay

## 2014-09-06 ENCOUNTER — Ambulatory Visit (HOSPITAL_COMMUNITY)
Admission: RE | Admit: 2014-09-06 | Discharge: 2014-09-06 | Disposition: A | Discharge status: Home / Self Care | Payer: Self-pay | Source: Ambulatory Visit | Attending: Obstetrics and Gynecology | Admitting: Obstetrics and Gynecology

## 2014-09-06 VITALS — BP 100/62 | Temp 98.4°F | Ht 62.0 in | Wt 126.0 lb

## 2014-09-06 DIAGNOSIS — Z01419 Encounter for gynecological examination (general) (routine) without abnormal findings: Secondary | ICD-10-CM

## 2014-09-06 NOTE — Addendum Note (Signed)
Encounter addended by: Armond Hang, LPN on: 0/10/6224 33:35 AM<BR>     Documentation filed: [employer], Orders

## 2014-09-06 NOTE — Patient Instructions (Signed)
Explained to Brittany Huber that BCCCP will cover Pap smears and co-testing every 5 years unless has a history of abnormal Pap smears. Let patient know will follow up with her within the next couple weeks with results of Pap smear by phone. Reminded patient that her screening mammogram is due in September and that she will need to come back to BCCCP to renew her BCCCP prior to mammogram. Brittany Huber verbalized understanding.

## 2014-09-06 NOTE — Progress Notes (Addendum)
No complaints today.  Pap Smear:  Pap smear completed today. Last Pap smear was 08/25/2011 at [facility] and normal. Per patient has a history of an abnormal Pap smear in 2010 that required a Colposcopy and LEEP for follow-up. Per patient has had at least three normal Pap smears since LEEP. Last Pap smear result is in EPIC.  Pelvic/Bimanual   Ext Genitalia No lesions, no swelling and no discharge observed on external genitalia.         Vagina Vagina pink and normal texture. No lesions or discharge observed in vagina.          Cervix Cervix is present. Cervix is reddened around os and of normal appearing texture. Cervix friable. No discharge observed.     Uterus Uterus is present and palpable. Uterus in normal position and normal size.        Adnexae Bilateral ovaries present and palpable. No tenderness on palpation.          Rectovaginal No rectal exam completed today since patient had no rectal complaints. No skin abnormalities observed on exam.      Used interpreter Benjamine Sprague.

## 2014-09-06 NOTE — Addendum Note (Signed)
Encounter addended by: Loletta Parish, RN on: 09/06/2014 11:27 AM<BR>     Documentation filed: Notes Section

## 2014-09-10 LAB — CYTOLOGY - PAP

## 2014-09-12 ENCOUNTER — Telehealth (HOSPITAL_COMMUNITY): Payer: Self-pay | Admitting: *Deleted

## 2014-09-12 NOTE — Telephone Encounter (Signed)
Telephoned patient at home # and discussed negative pap smear results. HPV was negative. Next pap smear due in 5 years. Patient voiced understanding. Used interpreter Lavon Paganini.

## 2015-01-02 ENCOUNTER — Other Ambulatory Visit: Payer: Self-pay | Admitting: Obstetrics and Gynecology

## 2015-01-07 ENCOUNTER — Other Ambulatory Visit (HOSPITAL_COMMUNITY): Payer: Self-pay | Admitting: Nurse Practitioner

## 2015-01-07 DIAGNOSIS — Z1231 Encounter for screening mammogram for malignant neoplasm of breast: Secondary | ICD-10-CM

## 2015-01-09 ENCOUNTER — Other Ambulatory Visit: Payer: Self-pay | Admitting: Obstetrics and Gynecology

## 2015-01-09 DIAGNOSIS — Z1231 Encounter for screening mammogram for malignant neoplasm of breast: Secondary | ICD-10-CM

## 2015-01-10 ENCOUNTER — Ambulatory Visit (HOSPITAL_COMMUNITY): Payer: Self-pay

## 2015-01-17 ENCOUNTER — Encounter (HOSPITAL_COMMUNITY): Payer: Self-pay | Admitting: *Deleted

## 2015-01-18 ENCOUNTER — Ambulatory Visit (HOSPITAL_COMMUNITY)
Admission: RE | Admit: 2015-01-18 | Discharge: 2015-01-18 | Disposition: A | Discharge status: Home / Self Care | Payer: Self-pay | Source: Ambulatory Visit | Attending: Obstetrics and Gynecology | Admitting: Obstetrics and Gynecology

## 2015-01-18 ENCOUNTER — Encounter (HOSPITAL_COMMUNITY): Payer: Self-pay

## 2015-01-18 VITALS — BP 100/64 | Temp 98.2°F | Ht 61.0 in | Wt 124.0 lb

## 2015-01-18 DIAGNOSIS — Z1231 Encounter for screening mammogram for malignant neoplasm of breast: Secondary | ICD-10-CM

## 2015-01-18 DIAGNOSIS — Z1239 Encounter for other screening for malignant neoplasm of breast: Secondary | ICD-10-CM

## 2015-01-18 NOTE — Progress Notes (Signed)
CLINIC:   [facility]enger transport manager) Clinic  REASON FOR VISIT: Well-woman exam  HISTORY OF PRESENT ILLNESS:   Brittany Huber is a 44 y.o. female who presents to the [facility] today for clinical breast exam. An interpreter, Lavon Paganini, accompanied the patient today. Her last mammogram was in August 2015 and additional imagine was recommended. Additional imaging was performed in September 2015 which was negative. Her last pap smear was performed in May 2016 and was negative; HPV -. Has a history of abnormal pap with colposcopy, but has had 3 negative paps since this time.   REVIEW OF SYSTEMS:   Denies breast pain, nodularity, skin changes, nipple inversion, or nipple discharge bilaterally.  Denies any pelvic pain, pressure, or abnormal vaginal bleeding.   ALLERGIES: No Known Allergies  MEDICATIONS:  Current outpatient prescriptions:  .  acyclovir (ZOVIRAX) 400 MG tablet, Take 400 mg by mouth 4 (four) times daily., Disp: , Rfl:  .  FLUoxetine (PROZAC) 20 MG tablet, Take 20 mg by mouth daily., Disp: , Rfl:  .  Homeopathic Products (EARACHE DROPS) SOLN, Place 4 drops in ear(s) daily as needed (for earache)., Disp: , Rfl:  .  Multiple Vitamin (MULTIVITAMIN) tablet, Take 1 tablet by mouth daily., Disp: , Rfl:  .  pravastatin (PRAVACHOL) 20 MG tablet, Take 20 mg by mouth daily., Disp: , Rfl:  .  predniSONE (DELTASONE) 10 MG tablet, Take 10-30 mg by mouth daily with breakfast., Disp: , Rfl:   PHYSICAL EXAM:   BP 100/64 mmHg  Temp(Src) 98.2 F (36.8 C) (Oral)  Ht 5\' 1"  (1.549 m)  Wt 124 lb (56.246 kg)  BMI 23.44 kg/m2  LMP 12/31/2014 (Exact Date)  General: Well-nourished, well-appearing female in no acute distress.  She is accompanied by her daughter and an interpreter in clinic today.  Brittany Infante, LPN was present during physical exam for this patient.   Breasts: Bilateral breasts exposed and observed with patient standing (arms at side, arms on hips, arms on  hips flexed forward, and arms over head).  No gross abnormalities including breast skin puckering or dimpling noted on observation.  Breasts symmetrical without evidence of skin redness, thickening, or peau d'orange appearance. No nipple retraction or nipple discharge noted bilaterally.  No breast nodularity palpated in bilateral breasts. Axillary lymph nodes: No axillary lymphadenopathy bilaterally.     ASSESSMENT & PLAN:  1. Breast cancer screening: Brittany Huber has no palpable breast abnormalities on her clinical breast exam today.  She will receive her screening mammogram as scheduled.  She will be contacted by the imaging center for results of the mammogram. She was given instructions and educational materials regarding breast self-awareness. [name] is aware of this plan and agrees with it.   2. Cervical cancer screening: Brittany Huber had a recent normal pap. Repeat pap is not indicated today.     Brittany Huber was encouraged to ask questions and all questions were answered to her satisfaction.      Mikey Bussing, DNP, AGPCNP-BC, [place] [phone]

## 2015-01-21 ENCOUNTER — Other Ambulatory Visit: Payer: Self-pay | Admitting: Obstetrics and Gynecology

## 2015-01-21 DIAGNOSIS — R928 Other abnormal and inconclusive findings on diagnostic imaging of breast: Secondary | ICD-10-CM

## 2015-01-30 ENCOUNTER — Ambulatory Visit
Admission: RE | Admit: 2015-01-30 | Discharge: 2015-01-30 | Disposition: A | Discharge status: Home / Self Care | Payer: No Typology Code available for payment source | Source: Ambulatory Visit | Attending: Obstetrics and Gynecology | Admitting: Obstetrics and Gynecology

## 2015-01-30 DIAGNOSIS — R928 Other abnormal and inconclusive findings on diagnostic imaging of breast: Secondary | ICD-10-CM

## 2016-02-03 ENCOUNTER — Other Ambulatory Visit: Payer: Self-pay | Admitting: Obstetrics and Gynecology

## 2016-02-03 DIAGNOSIS — Z1231 Encounter for screening mammogram for malignant neoplasm of breast: Secondary | ICD-10-CM

## 2016-02-12 ENCOUNTER — Encounter (HOSPITAL_COMMUNITY): Payer: Self-pay | Admitting: *Deleted

## 2016-02-13 ENCOUNTER — Encounter (HOSPITAL_COMMUNITY): Payer: Self-pay

## 2016-02-13 ENCOUNTER — Ambulatory Visit (HOSPITAL_COMMUNITY)
Admission: RE | Admit: 2016-02-13 | Discharge: 2016-02-13 | Disposition: A | Discharge status: Home / Self Care | Payer: Self-pay | Source: Ambulatory Visit | Attending: Obstetrics and Gynecology | Admitting: Obstetrics and Gynecology

## 2016-02-13 ENCOUNTER — Ambulatory Visit
Admission: RE | Admit: 2016-02-13 | Discharge: 2016-02-13 | Disposition: A | Discharge status: Home / Self Care | Payer: No Typology Code available for payment source | Source: Ambulatory Visit | Attending: Obstetrics and Gynecology | Admitting: Obstetrics and Gynecology

## 2016-02-13 VITALS — BP 112/64 | Temp 98.4°F | Ht 63.0 in | Wt 135.6 lb

## 2016-02-13 DIAGNOSIS — Z1239 Encounter for other screening for malignant neoplasm of breast: Secondary | ICD-10-CM

## 2016-02-13 DIAGNOSIS — Z1231 Encounter for screening mammogram for malignant neoplasm of breast: Secondary | ICD-10-CM

## 2016-02-13 NOTE — Patient Instructions (Signed)
Explained breast self awareness to Brittany Huber. Patient did not need a Pap smear today due to last Pap smear and HPV typing was 09/06/2014. Told patient about free cervical cancer screenings to receive a Pap smear if would like one within the next couple of years. Let her know BCCCP will cover Pap smears and HPV typing every 5 years unless has a history of abnormal Pap smears. Referred patient to the [facility] for a screening mammogram. Appointment scheduled for Thursday, February 13, 2016 at 1530. Let patient know the Breast Center will follow up with her within the next couple weeks with results of mammogram by letter or phone. Brittany Huber verbalized understanding.  Jassiah Viviano, Arvil Chaco, RN 4:26 PM

## 2016-02-13 NOTE — Progress Notes (Signed)
No complaints today.   Pap Smear:  Pap smear not completed today. Last Pap smear was 09/06/2014 at [facility] and normal with negative HPV. Per patient has a history of an abnormal Pap smear in 2010 that required a Colposcopy and LEEP for follow-up. Per patient has had at least three normal Pap smears since LEEP. Last three Pap smear results are in EPIC.  Physical exam: Breasts Breasts symmetrical. No skin abnormalities bilateral breasts. No nipple retraction bilateral breasts. No nipple discharge bilateral breasts. No lymphadenopathy. No lumps palpated bilateral breasts. No complaints of pain or tenderness on exam. Referred patient to the [facility] for a screening mammogram. Appointment scheduled for Thursday, February 13, 2016 at 1530.        Pelvic/Bimanual No Pap smear completed today since last Pap smear was 09/06/2014. Pap smear not indicated per BCCCP guidelines.   Smoking History: Patient has never smoked.  Patient Navigation: Patient education provided. Access to services provided for patient through [facility] program. Spanish interpreter provided.  Used Spanish interpreter [employer] from [place].

## 2016-02-14 ENCOUNTER — Encounter (HOSPITAL_COMMUNITY): Payer: Self-pay | Admitting: *Deleted

## 2016-02-17 ENCOUNTER — Other Ambulatory Visit: Payer: Self-pay | Admitting: Obstetrics and Gynecology

## 2016-02-17 DIAGNOSIS — R928 Other abnormal and inconclusive findings on diagnostic imaging of breast: Secondary | ICD-10-CM

## 2016-02-25 ENCOUNTER — Ambulatory Visit
Admission: RE | Admit: 2016-02-25 | Discharge: 2016-02-25 | Disposition: A | Discharge status: Home / Self Care | Payer: No Typology Code available for payment source | Source: Ambulatory Visit | Attending: Obstetrics and Gynecology | Admitting: Obstetrics and Gynecology

## 2016-02-25 DIAGNOSIS — R928 Other abnormal and inconclusive findings on diagnostic imaging of breast: Secondary | ICD-10-CM

## 2017-03-12 ENCOUNTER — Other Ambulatory Visit: Payer: Self-pay | Admitting: Obstetrics and Gynecology

## 2017-03-12 DIAGNOSIS — Z1231 Encounter for screening mammogram for malignant neoplasm of breast: Secondary | ICD-10-CM

## 2017-03-15 ENCOUNTER — Encounter (HOSPITAL_COMMUNITY): Payer: Self-pay

## 2017-03-23 ENCOUNTER — Encounter (HOSPITAL_COMMUNITY): Payer: Self-pay

## 2017-03-23 ENCOUNTER — Emergency Department (HOSPITAL_COMMUNITY)
Admission: EM | Admit: 2017-03-23 | Discharge: 2017-03-23 | Disposition: A | Discharge status: Home / Self Care | Payer: Self-pay | Attending: Emergency Medicine | Admitting: Emergency Medicine

## 2017-03-23 ENCOUNTER — Emergency Department (HOSPITAL_COMMUNITY): Payer: Self-pay

## 2017-03-23 ENCOUNTER — Other Ambulatory Visit: Payer: Self-pay

## 2017-03-23 DIAGNOSIS — H9201 Otalgia, right ear: Secondary | ICD-10-CM | POA: Insufficient documentation

## 2017-03-23 DIAGNOSIS — M25521 Pain in right elbow: Secondary | ICD-10-CM | POA: Insufficient documentation

## 2017-03-23 DIAGNOSIS — Z79899 Other long term (current) drug therapy: Secondary | ICD-10-CM | POA: Insufficient documentation

## 2017-03-23 MED ORDER — PSEUDOEPHEDRINE HCL 30 MG PO TABS: 30.0000 mg | ORAL_TABLET | ORAL | 0 refills | Status: DC | PRN

## 2017-03-23 MED ORDER — FLUTICASONE PROPIONATE 50 MCG/ACT NA SUSP: 2.0000 | Freq: Every day | NASAL | 2 refills | Status: DC

## 2017-03-23 NOTE — Discharge Instructions (Signed)
Please schedule an apt and follow up with hand specialist for further evaluation of your hand pain and weakness related to elbow injury. Please take flonase and sudafed to help with congestion which will help your ear pain. Ibuprofen or tylenol for pain. Return if any worsening symptoms.

## 2017-03-23 NOTE — ED Notes (Signed)
Pt has drops at the bedside that she reports using on her ear with no relief. SHe also c/o pain in right elbow from injury 3 years ago. She states the pain in her elbow is getting worse.

## 2017-03-23 NOTE — ED Provider Notes (Signed)
[facility] EMERGENCY DEPARTMENT Provider Note   CSN: [id number] Arrival date & time: 03/23/17  1028     History   Chief Complaint No chief complaint on file.   HPI Brittany Huber is a 46 y.o. female.  HPI Brittany Huber is a 46 y.o. female presents to ED with several complaints. Pt is here with pain in the right ear for several days and pain in right elbow for several years.  Patient is ear pain started several days ago.  She denies any drainage.  She states she feels like sounds are muffled.  She has tried peroxide which helps some.  Denies any nasal congestion.  No sore throat.  No fever or chills.  Patient's elbow pain started 2 years ago, patient states she cut her elbow on a glass.  She did not go to the doctor that time.  Elbow has healed, but continues to have some discomfort in it.  She states that her ring finger feels weak, and sometimes pain shoots all the way from her elbow into the ring finger.  Denies any numbness.  She does report some weakness intermittently.  No recent or new injuries. No treatment at home prior to coming in.  History reviewed. No pertinent past medical history.  Patient Active Problem List   Diagnosis Date Noted  . Dysmenorrhea 07/08/2011    Past Surgical History:  Procedure Laterality Date  . CERVICAL BIOPSY  W/ LOOP ELECTRODE EXCISION    . COLPOSCOPY      OB History    Gravida Para Term Preterm AB Living   5 4 4  0 1 3   SAB TAB Ectopic Multiple Live Births     1     4       Home Medications    Prior to Admission medications   Medication Sig Start Date End Date Taking? Authorizing Provider  acyclovir (ZOVIRAX) 400 MG tablet Take 400 mg by mouth 4 (four) times daily.    [provider]  FLUoxetine (PROZAC) 20 MG tablet Take 20 mg by mouth daily.    [provider]  Homeopathic Products (EARACHE DROPS) SOLN Place 4 drops in ear(s) daily as needed (for earache).    [provider]   Multiple Vitamin (MULTIVITAMIN) tablet Take 1 tablet by mouth daily.    [provider]  pravastatin (PRAVACHOL) 20 MG tablet Take 20 mg by mouth daily.    [provider]  predniSONE (DELTASONE) 10 MG tablet Take 10-30 mg by mouth daily with breakfast.    [provider]    Family History Family History  Problem Relation Age of Onset  . Cancer Maternal Grandmother        uterine cancer  . Hypertension Mother     Social History Social History   Tobacco Use  . Smoking status: Never Smoker  . Smokeless tobacco: Never Used  Substance Use Topics  . Alcohol use: No  . Drug use: No     Allergies   Patient has no known allergies.   Review of Systems Review of Systems  Constitutional: Negative for chills and fever.  HENT: Positive for ear pain. Negative for congestion, ear discharge, hearing loss and sore throat.   Respiratory: Negative for cough, chest tightness and shortness of breath.   Cardiovascular: Negative for chest pain, palpitations and leg swelling.  Genitourinary: Negative for dysuria, flank pain and pelvic pain.  Musculoskeletal: Positive for arthralgias and joint swelling. Negative for myalgias, neck  pain and neck stiffness.  Skin: Negative for rash.  Neurological: Negative for dizziness, weakness and headaches.  All other systems reviewed and are negative.    Physical Exam Updated Vital Signs BP 122/84   Pulse 68   Temp 98.4 F (36.9 C) (Oral)   Resp 16   SpO2 100%   Physical Exam  Constitutional: She appears well-developed and well-nourished. No distress.  HENT:  Head: Normocephalic.  Bilateral external ears normal.  Normal ear canals bilaterally.  Right TM with possible small fluid, no erythema, no bulging, no retraction.  Normal left TM.  Oropharynx is normal.  Eyes: Conjunctivae are normal.  Neck: Neck supple.  Cardiovascular: Normal rate, regular rhythm and normal heart sounds.  Pulmonary/Chest: Effort normal and  breath sounds normal. No respiratory distress. She has no wheezes. She has no rales.  Musculoskeletal: She exhibits no edema.  Old healed laceration to the medial right elbow.  No tenderness to palpation over the area.  Full range of motion of the right elbow.  Distal radial pulses intact.  Strength intact in all fingers with flexion and extension.  Capillary refill less than 2 seconds distally.  Neurological: She is alert.  Skin: Skin is warm and dry.  Psychiatric: She has a normal mood and affect. Her behavior is normal.  Nursing note and vitals reviewed.    ED Treatments / Results  Labs (all labs ordered are listed, but only abnormal results are displayed) Labs Reviewed - No data to display  EKG  EKG Interpretation None       Radiology No results found.  Procedures Procedures (including critical care time)  Medications Ordered in ED Medications - No data to display   Initial Impression / Assessment and Plan / ED Course  I have reviewed the triage vital signs and the nursing notes.  Pertinent labs & imaging results that were available during my care of the patient were reviewed by me and considered in my medical decision making (see chart for details).     Pt in ed with two separate complaints.  Patient is complaining of persistent right elbow pain with some weakness and pain in her right fourth finger.  At this time her exam of the hand and finger is unremarkable, however she states that she sometimes has difficulty feeling and moving it.  Her x-ray of the elbow is negative.  Her laceration is over medial joint, question whether she could have a partial injury to the tendon or to a nerve.  I will refer her to a hand specialist.  Patient is right ear pain is most likely from fluid behind her TM.  Will start on decongestants, Flonase and Sudafed.  We will have her follow-up with family doctor.  At this time patient stable for discharge home, close outpatient follow-up, return  precautions discussed  Vitals:   03/23/17 1049  BP: 122/84  Pulse: 68  Resp: 16  Temp: 98.4 F (36.9 C)  TempSrc: Oral  SpO2: 100%     Final Clinical Impressions(s) / ED Diagnoses   Final diagnoses:  Right ear pain  Right elbow pain    ED Discharge Orders    None       Jeannett Senior, PA-C 03/23/17 1429    Pixie Casino, MD 03/23/17 1447

## 2017-03-23 NOTE — ED Triage Notes (Signed)
Patient complains of right ear pain intermittently with congestion x 1 month, alert and oriented, NAD

## 2017-04-15 ENCOUNTER — Ambulatory Visit (HOSPITAL_COMMUNITY)
Admission: RE | Admit: 2017-04-15 | Discharge: 2017-04-15 | Disposition: A | Discharge status: Home / Self Care | Payer: Self-pay | Source: Ambulatory Visit | Attending: Obstetrics and Gynecology | Admitting: Obstetrics and Gynecology

## 2017-04-15 ENCOUNTER — Encounter (HOSPITAL_COMMUNITY): Payer: Self-pay | Admitting: *Deleted

## 2017-04-15 ENCOUNTER — Ambulatory Visit
Admission: RE | Admit: 2017-04-15 | Discharge: 2017-04-15 | Disposition: A | Discharge status: Home / Self Care | Payer: No Typology Code available for payment source | Source: Ambulatory Visit | Attending: Obstetrics and Gynecology | Admitting: Obstetrics and Gynecology

## 2017-04-15 VITALS — BP 100/62 | Temp 98.6°F | Ht 63.0 in | Wt 138.4 lb

## 2017-04-15 DIAGNOSIS — Z1239 Encounter for other screening for malignant neoplasm of breast: Secondary | ICD-10-CM

## 2017-04-15 DIAGNOSIS — Z1231 Encounter for screening mammogram for malignant neoplasm of breast: Secondary | ICD-10-CM

## 2017-04-15 NOTE — Addendum Note (Signed)
Encounter addended by: Glennie Hawk, LPN on: 59/45/8592 9:24 AM  Actions taken: Vitals modified

## 2017-04-15 NOTE — Progress Notes (Signed)
No complaints today.   Pap Smear: Pap smear not completed today. Last Pap smear was 09/06/2014 at [facility] and normal with negative HPV. Per patient has a history of an abnormal Pap smear in 2010 that required a Colposcopy and LEEP for follow-up. Per patient has had at least three normal Pap smears since LEEP. Last three Pap smear results are in EPIC.  Physical exam: Breasts Breasts symmetrical. No skin abnormalities bilateral breasts. No nipple retraction bilateral breasts. No nipple discharge bilateral breasts. No lymphadenopathy. No lumps palpated bilateral breasts. No complaints of pain or tenderness on exam. Referred patient to the [facility] for a screening mammogram. Appointment scheduled for Thursday, April 15, 2017 at 0910.        Pelvic/Bimanual No Pap smear completed today since last Pap smear and HPV typing was 09/06/2014. Pap smear not indicated per BCCCP guidelines.   Smoking History: Patient has never smoked.  Patient Navigation: Patient education provided. Access to services provided for patient through [facility] program. Spanish interpreter provided.  Used Spanish interpreter [employer] from [place].

## 2017-04-15 NOTE — Patient Instructions (Signed)
Explained breast self awareness with Avelino Leeds Lucas-Lopez. Patient did not need a Pap smear today due to last Pap smear and HPV typing was 09/06/2014. Let her know BCCCP will cover Pap smears every 3 years or every 5 years with HPV typing unless has a history of abnormal Pap smears. Referred patient to the [facility] for a screening mammogram. Appointment scheduled for Thursday, April 15, 2017 at 0910. Let patient know the Breast Center will follow up with her within the next couple weeks with results of mammogram by letter or phone. Avelino Leeds Lucas-Lopez verbalized understanding.  Hildy Nicholl, Arvil Chaco, RN 8:53 AM

## 2017-04-16 ENCOUNTER — Encounter (HOSPITAL_COMMUNITY): Payer: Self-pay | Admitting: *Deleted

## 2017-05-11 ENCOUNTER — Encounter (HOSPITAL_COMMUNITY): Payer: Self-pay

## 2018-05-10 ENCOUNTER — Other Ambulatory Visit (HOSPITAL_COMMUNITY): Payer: Self-pay | Admitting: *Deleted

## 2018-05-10 DIAGNOSIS — Z1231 Encounter for screening mammogram for malignant neoplasm of breast: Secondary | ICD-10-CM

## 2018-06-16 ENCOUNTER — Ambulatory Visit: Payer: Self-pay | Admitting: Internal Medicine

## 2018-06-16 ENCOUNTER — Encounter: Payer: Self-pay | Admitting: Internal Medicine

## 2018-06-16 VITALS — BP 110/72 | HR 63 | Temp 98.2°F | Ht 60.8 in | Wt 137.6 lb

## 2018-06-16 DIAGNOSIS — J302 Other seasonal allergic rhinitis: Secondary | ICD-10-CM

## 2018-06-16 DIAGNOSIS — Z780 Asymptomatic menopausal state: Secondary | ICD-10-CM

## 2018-06-16 DIAGNOSIS — E7800 Pure hypercholesterolemia, unspecified: Secondary | ICD-10-CM

## 2018-06-16 DIAGNOSIS — Z0000 Encounter for general adult medical examination without abnormal findings: Secondary | ICD-10-CM

## 2018-06-16 DIAGNOSIS — N9419 Other specified dyspareunia: Secondary | ICD-10-CM

## 2018-06-16 DIAGNOSIS — Z131 Encounter for screening for diabetes mellitus: Secondary | ICD-10-CM

## 2018-06-16 LAB — POCT URINALYSIS DIPSTICK
BILIRUBIN UA: NEGATIVE
Glucose, UA: NEGATIVE
KETONES UA: NEGATIVE
Leukocytes, UA: NEGATIVE
Nitrite, UA: NEGATIVE
PH UA: 6 (ref 5.0–8.0)
PROTEIN UA: NEGATIVE
SPEC GRAV UA: 1.025 (ref 1.010–1.025)
UROBILINOGEN UA: 0.2 U/dL

## 2018-06-16 NOTE — Progress Notes (Signed)
Subjective:     Patient ID: Brittany Huber , female    DOB: 1970/08/22 , 48 y.o.   MRN: [id number]   Chief Complaint  Patient presents with  . Establish Care    patient presents today to establish pcp.  . Annual Exam    HPI  Pt is here to establish care with me and would like to have a complete physical. Has apt with GYN on Monday to have a pap. She is up to date on mammogram and pap's.  In the past was told her cholesterol was elevated and she changed her diet, but has not had it checked since.   PMHx- Hyperlipidemia  Family History  Problem Relation Age of Onset  . Cancer Maternal Grandmother        uterine cancer  . Hypertension Mother     No current outpatient medications on file.   No Known Allergies   Review of Systems  Constitutional: Negative for appetite change, chills, fatigue, fever and unexpected weight change.  HENT: Negative.   Eyes: Negative.   Respiratory: Negative for cough, chest tightness and shortness of breath.   Cardiovascular: Negative for chest pain, palpitations and leg swelling.  Gastrointestinal: Negative for abdominal pain, anal bleeding, blood in stool, constipation, diarrhea and vomiting.  Endocrine: Negative for cold intolerance, heat intolerance, polydipsia, polyphagia and polyuria.  Genitourinary: Positive for dyspareunia. Negative for decreased urine volume, difficulty urinating, dysuria, enuresis, flank pain, frequency, genital sores, hematuria, menstrual problem, pelvic pain, urgency, vaginal bleeding, vaginal discharge and vaginal pain.       States her labia majora gets tender with penetration since she has been menopausal. Plans to discuss this with her GYN.   Musculoskeletal: Negative for arthralgias, gait problem, neck pain and neck stiffness.  Skin: Negative for color change, pallor, rash and wound.  Allergic/Immunologic: Positive for environmental allergies. Negative for food allergies.  Neurological: Negative for dizziness,  tremors, weakness, numbness and headaches.  Hematological: Negative for adenopathy.  Psychiatric/Behavioral: Negative for agitation and sleep disturbance. The patient is not nervous/anxious.      Today's Vitals   06/16/18 0859  BP: 110/72  Pulse: 63  Temp: 98.2 F (36.8 C)  TempSrc: Oral  SpO2: 97%  Weight: 137 lb 9.6 oz (62.4 kg)  Height: 5' 0.8" (1.544 m)  PainSc: 0-No pain   Body mass index is 26.17 kg/m.   Objective:  Physical Exam  BP 110/72 (BP Location: Left Arm, Patient Position: Sitting, Cuff Size: Small)   Pulse 63   Temp 98.2 F (36.8 C) (Oral)   Ht 5' 0.8" (1.544 m)   Wt 137 lb 9.6 oz (62.4 kg)   SpO2 97%   BMI 26.17 kg/m   General Appearance:    Alert, cooperative, no distress, appears stated age  Head:    Normocephalic, without obvious abnormality, atraumatic  Eyes:    PERRL, conjunctiva/corneas clear, EOM's intact, fundi    benign, both eyes  Ears:    Normal TM's and external ear canals, both ears  Nose:   Nares normal, septum midline, mucosa normal, no drainage    or sinus tenderness  Throat:   Lips, mucosa, and tongue normal; teeth and gums normal  Neck:   Supple, symmetrical, trachea midline, no adenopathy;    thyroid:  no enlargement/tenderness/nodules; no carotid   bruit or JVD  Back:     Symmetric, no curvature, ROM normal, no CVA tenderness  Lungs:     Clear to auscultation bilaterally, respirations unlabored  Chest  Wall:    No tenderness or deformity   Heart:    Regular rate and rhythm, S1 and S2 normal, no murmur, rub   or gallop  Breast Exam:    No tenderness, masses, or nipple abnormality  Abdomen:     Soft, non-tender, bowel sounds active all four quadrants,    no masses, no organomegaly        Extremities:   Extremities normal, atraumatic, no cyanosis or edema  Pulses:   2+ and symmetric all extremities  Skin:   Skin color, texture, turgor normal, no rashes or lesions  Lymph nodes:   Cervical, supraclavicular, and axillary nodes  normal  Neurologic:   CNII-XII intact, normal strength, sensation and reflexes    Throughout. Normal Rhomberg, tandem gait, finger to nose, heel and tip toe gait.    Assessment And Plan:    1. Encounter for general adult medical examination w/o abnormal findings- routine. FU 1 y - POCT Urinalysis Dipstick (81002) - TSH - T4, Free - T3, free - CMP14 + Anion Gap - CBC no Diff - Lipid Profile  2. Seasonal allergies- stable. No action.   3. Pure hypercholesterolemia- unknown status.  - Lipid Profile  4. Diabetes mellitus screening-screen - Hemoglobin A1c 5- Dyspareunia- chronic. Will discuss this with GYN 6- Manopause- chronic. No action.   FU in 6 months for lipid recheck.  Infant Zink RODRIGUEZ-SOUTHWORTH, PA-C

## 2018-06-16 NOTE — Patient Instructions (Signed)
Cuidados preventivos en las mujeres de 40 a 64 aos de edad Preventive Care 40-64 Years, Female Introduccin Los cuidados preventivos hacen referencia a las opciones en cuanto al estilo de vida y a las visitas al mdico, las cuales pueden promover la salud y el bienestar. Qu incluyen los cuidados preventivos?   Un examen fsico anual. Esto tambin se conoce como control de bienestar anual.  Exmenes dentales una o dos veces al ao.  Exmenes de la vista de rutina. Pregntele al mdico con qu frecuencia debe realizarse un control de la vista.  Opciones personales de estilo de vida, que incluyen lo siguiente: ? Cuidarse los dientes y las encas a diario. ? Realizar actividad fsica con regularidad. ? Seguir una dieta saludable. ? Evitar el consumo de tabaco y drogas. ? Limitar el consumo de bebidas alcohlicas. ? Practicar el sexo seguro. ? Tomar una dosis baja de aspirina diariamente a partir de los 50 aos de edad. ? Tomar los suplementos de vitaminas o minerales como se lo haya indicado el mdico. Qu sucede durante un control de bienestar anual? Los servicios y exmenes de deteccin realizados por su mdico durante el control de bienestar anual dependern de su salud general, sus factores de riesgo de estilo de vida y sus antecedentes familiares de enfermedades. Asesoramiento Su mdico puede preguntarle acerca de:  Su consumo de alcohol.  Su consumo de tabaco.  Su consumo de drogas.  Su bienestar emocional.  El bienestar en el hogar y sus relaciones personales.  Su actividad sexual.  Sus hbitos de alimentacin.  Su trabajo y ambiente laboral.  Mtodos anticonceptivos.  Su ciclo menstrual.  Sus antecedentes de embarazo. Pruebas de deteccin Pueden hacerle las siguientes pruebas o mediciones:  Estatura, peso e ndice de masa muscular (IMC).  Presin arterial.  Niveles de lpidos y colesterol. Estos se pueden verificar cada 5 aos o, con ms frecuencia, si  usted tiene ms de 50 aos de edad.  Control de la piel.  Pruebas de deteccin de cncer de pulmn. Es posible que se le realice esta prueba de deteccin a partir de los 55 aos de edad, si ha fumado durante 30 aos un paquete diario y sigue fumando o dej el hbito en algn momento en los ltimos 15 aos.  Pruebas de deteccin de cncer colorrectal. Todos los adultos a partir de los 50 aos de edad y hasta los 75 aos de edad deben hacerse esta prueba de deteccin. El mdico puede recomendarle las pruebas de deteccin a partir de los 45 aos de edad. Le realizarn pruebas cada 1 a 10 aos, segn los resultados y el tipo de prueba de deteccin. Las personas que tienen un mayor riesgo deben comenzar con las pruebas de deteccin a una edad ms temprana. Las pruebas de deteccin pueden incluir: ? Prueba de sangre oculta en materia fecal con guayacol. ? Prueba inmunoqumica fecal (PIF). ? Prueba de cido desoxirribonucleico (ADN) en heces. ? Colonoscopa virtual. ? Sigmoidoscopa. Durante esta prueba, se utiliza un tubo flexible con una cmara diminuta (sigmoidoscopio) para examinar el recto y la parte inferior del colon. El sigmoidoscopio se inserta a travs del ano en el recto y la parte inferior del colon. ? Colonoscopa. Durante esta prueba, se utiliza un tubo largo, delgado y flexible con una cmara diminuta (colonoscopio) para examinar todo el colon y el recto.  Anlisis de sangre para la deteccin de la hepatitis C.  Anlisis de sangre para la deteccin de la hepatitis B.  Anlisis de enfermedades de transmisin   sexual (ETS).  Pruebas de deteccin de la diabetes. Esto se realiza mediante un control del azcar en la sangre (glucosa) despus de no haber comido durante un periodo de tiempo (ayuno). Es posible que se le realice esta prueba cada 1 a 3 aos.  Mamografa. Se puede realizar cada 1 o 2 aos. Hable con su mdico sobre cundo debe comenzar a realizarse mamografas de manera regular.  Esto depende de si tiene antecedentes familiares de cncer de mama o no.  Pruebas de deteccin de cncer relacionado con las mutaciones del BRCA. Es posible que se las deba realizar si tiene antecedentes de cncer de mama, de ovario, de trompas o peritoneal.  Examen plvico y prueba de Papanicolaou. Esto se puede realizar cada 3aos a partir de los 21aos de edad. A partir de los 30 aos, esto se puede realizar cada 5 aos si usted se realiza una prueba de Papanicolaou en combinacin con una prueba de deteccin del virus del papiloma humano (VPH).  Densitometra sea. Esto se realiza para detectar osteoporosis. Se le puede realizar este examen de deteccin si tiene un riesgo alto de tener osteoporosis. Hable con su mdico para analizar los resultados, las opciones de tratamiento y, si corresponde, la necesidad de realizar ms pruebas. Vacunas El mdico puede recomendarle que se aplique algunas vacunas, por ejemplo:  Vacuna contra la gripe. Se recomienda aplicarse esta vacuna todos los aos.  Vacuna contra la difteria, ttanos y tos ferina acelular (DTPa, DT). Es posible que tenga que aplicarse un refuerzo contra el ttanos y la difteria (DT) cada 10aos.  Vacuna contra la varicela. Es posible que tenga que aplicrsela si no recibi esta vacuna.  Vacuna contra el herpes zster. Es posible que la necesite despus de los 60 aos de edad.  Vacuna contra el sarampin, rubola y paperas (SRP). Es posible que necesite aplicarse al menos una dosis de la vacuna SRP si naci despus de 1957. Podra tambin necesitar una segunda dosis.  Vacuna antineumoccica conjugada 13 valente (PCV13). Puede necesitar esta vacuna si tiene determinadas enfermedades y no se vacun anteriormente.  Vacuna antineumoccica de polisacridos (PPSV23). Quizs tenga que aplicarse una o dos dosis si fuma o si tiene determinadas afecciones.  Vacuna antimeningoccica. Puede necesitar esta vacuna si tiene determinadas  afecciones.  Vacuna contra la hepatitis A. Es posible que necesite esta vacuna si tiene ciertas afecciones o si viaja o trabaja en lugares en los que podra estar expuesta a la hepatitis A.  Vacuna contra la hepatitis B. Es posible que necesite esta vacuna si tiene ciertas afecciones o si viaja o trabaja en lugares en los que podra estar expuesta a la hepatitis B.  Vacuna contra la Haemophilus influenzae de tipob (Hib). Puede necesitar esta vacuna si tiene determinadas afecciones. Hable con el mdico sobre qu pruebas de deteccin y qu vacunas necesita, y con qu frecuencia las necesita. Esta informacin no tiene como fin reemplazar el consejo del mdico. Asegrese de hacerle al mdico cualquier pregunta que tenga. Document Released: 08/25/2016 Document Revised: 07/26/2017 Document Reviewed: 02/12/2015 Elsevier Interactive Patient Education  2019 Elsevier Inc.  

## 2018-06-17 LAB — CMP14 + ANION GAP
ALT: 24 IU/L (ref 0–32)
AST: 17 IU/L (ref 0–40)
Albumin/Globulin Ratio: 1.6 (ref 1.2–2.2)
Albumin: 4.6 g/dL (ref 3.8–4.8)
Alkaline Phosphatase: 124 IU/L — ABNORMAL HIGH (ref 39–117)
Anion Gap: 13 mmol/L (ref 10.0–18.0)
BILIRUBIN TOTAL: 0.4 mg/dL (ref 0.0–1.2)
BUN/Creatinine Ratio: 24 — ABNORMAL HIGH (ref 9–23)
BUN: 13 mg/dL (ref 6–24)
CHLORIDE: 102 mmol/L (ref 96–106)
CO2: 27 mmol/L (ref 20–29)
Calcium: 9.9 mg/dL (ref 8.7–10.2)
Creatinine, Ser: 0.55 mg/dL — ABNORMAL LOW (ref 0.57–1.00)
GFR, EST AFRICAN AMERICAN: 128 mL/min/{1.73_m2} (ref >59)
GFR, EST NON AFRICAN AMERICAN: 111 mL/min/{1.73_m2} (ref >59)
Globulin, Total: 2.8 g/dL (ref 1.5–4.5)
Glucose: 90 mg/dL (ref 65–99)
Potassium: 4.6 mmol/L (ref 3.5–5.2)
Sodium: 142 mmol/L (ref 134–144)
TOTAL PROTEIN: 7.4 g/dL (ref 6.0–8.5)

## 2018-06-17 LAB — LIPID PANEL
CHOL/HDL RATIO: 4.1 ratio (ref 0.0–4.4)
Cholesterol, Total: 269 mg/dL — ABNORMAL HIGH (ref 100–199)
HDL: 65 mg/dL (ref >39)
LDL CALC: 170 mg/dL — AB (ref 0–99)
Triglycerides: 168 mg/dL — ABNORMAL HIGH (ref 0–149)
VLDL Cholesterol Cal: 34 mg/dL (ref 5–40)

## 2018-06-17 LAB — CBC
HEMATOCRIT: 42.3 % (ref 34.0–46.6)
HEMOGLOBIN: 14.3 g/dL (ref 11.1–15.9)
MCH: 29.6 pg (ref 26.6–33.0)
MCHC: 33.8 g/dL (ref 31.5–35.7)
MCV: 88 fL (ref 79–97)
Platelets: 269 10*3/uL (ref 150–450)
RBC: 4.83 x10E6/uL (ref 3.77–5.28)
RDW: 13.1 % (ref 11.7–15.4)
WBC: 6 10*3/uL (ref 3.4–10.8)

## 2018-06-17 LAB — T3, FREE: T3, Free: 3.8 pg/mL (ref 2.0–4.4)

## 2018-06-17 LAB — TSH: TSH: 1.63 u[IU]/mL (ref 0.450–4.500)

## 2018-06-17 LAB — T4, FREE: Free T4: 1.38 ng/dL (ref 0.82–1.77)

## 2018-06-20 ENCOUNTER — Other Ambulatory Visit: Payer: Self-pay | Admitting: *Deleted

## 2018-06-20 DIAGNOSIS — Z124 Encounter for screening for malignant neoplasm of cervix: Secondary | ICD-10-CM

## 2018-06-21 ENCOUNTER — Encounter: Payer: Self-pay | Admitting: Internal Medicine

## 2018-06-21 DIAGNOSIS — N9419 Other specified dyspareunia: Secondary | ICD-10-CM | POA: Insufficient documentation

## 2018-06-21 DIAGNOSIS — E7800 Pure hypercholesterolemia, unspecified: Secondary | ICD-10-CM | POA: Insufficient documentation

## 2018-06-21 NOTE — Progress Notes (Signed)
Patient: Brittany Huber           Date of Birth: 10-05-1970           MRN: [id number] Visit Date: 06/20/2018 PCP: [name], PA-C  Cervical Cancer Screening Do you smoke?: No Have you ever had or been told you have an allergy to latex products?: No Marital status: Single Date of last pap smear: Don't know Date of last menstrual period: 05/28/16 Number of pregnancies: 5 Number of births: 5  Cervical Exam Exam not completed.  Patient's History Patient Active Problem List   Diagnosis Date Noted  . Pure hypercholesterolemia 06/21/2018  . Other specified dyspareunia 06/21/2018  . Menopause 06/16/2018  . Seasonal allergies 06/16/2018   No past medical history on file.  Family History  Problem Relation Age of Onset  . Cancer Maternal Grandmother        uterine cancer  . Hypertension Mother     Social History   Occupational History  . Not on file  Tobacco Use  . Smoking status: Never Smoker  . Smokeless tobacco: Never Used  Substance and Sexual Activity  . Alcohol use: No  . Drug use: No  . Sexual activity: Yes    Birth control/protection: None, Condom   Patient examined by Dr. Leo Grosser. Screening notes scanned into Epic.

## 2018-06-21 NOTE — Addendum Note (Signed)
Addended by: Nicki Guadalajara on: 06/21/2018 08:32 AM   Modules accepted: Level of Service

## 2018-06-23 LAB — CYTOLOGY - PAP: DIAGNOSIS: NEGATIVE

## 2018-07-06 ENCOUNTER — Telehealth: Payer: Self-pay | Admitting: Internal Medicine

## 2018-07-06 NOTE — Telephone Encounter (Signed)
I gave it to [place] who will check on this for me. I told pt I will call her back when I find out more information.

## 2018-07-07 NOTE — Telephone Encounter (Signed)
[place] was able to find out, she was already given a small discount, but is not the usual 50-55 % [facility] gives since we dont have that many pts on sliding scale, but the financial department suggested for me to write a letter requesting the discount and to have pt hold from sending any payment til she gets a new bill. I called pt and informed her of all this. I will work on this letter today.

## 2018-07-12 ENCOUNTER — Ambulatory Visit
Admission: RE | Admit: 2018-07-12 | Discharge: 2018-07-12 | Disposition: A | Discharge status: Home / Self Care | Payer: Self-pay | Source: Ambulatory Visit | Attending: Obstetrics and Gynecology | Admitting: Obstetrics and Gynecology

## 2018-07-12 ENCOUNTER — Ambulatory Visit (HOSPITAL_COMMUNITY): Payer: No Typology Code available for payment source

## 2018-07-12 ENCOUNTER — Other Ambulatory Visit: Payer: Self-pay

## 2018-07-12 DIAGNOSIS — Z1231 Encounter for screening mammogram for malignant neoplasm of breast: Secondary | ICD-10-CM

## 2018-07-13 ENCOUNTER — Encounter: Payer: Self-pay | Admitting: Internal Medicine

## 2018-07-13 NOTE — Telephone Encounter (Signed)
Letter was written and given to [place] to be mailed to [employer].

## 2018-07-25 ENCOUNTER — Telehealth (HOSPITAL_COMMUNITY): Payer: Self-pay | Admitting: *Deleted

## 2018-07-25 NOTE — Telephone Encounter (Signed)
Normal Pap smear result letter mailed to patient by Cytology. 

## 2018-08-21 ENCOUNTER — Telehealth: Payer: Self-pay | Admitting: Internal Medicine

## 2018-08-21 NOTE — Telephone Encounter (Signed)
Pt called my cell which I had given to her husband yesterday when I was checking on him and her. He said she had some health concerns she wanted to ask me about.  She mentioned to me she believes maybe her BP is elevated ,but has not checked it. She has developed symptoms of chest pressure which only lasts a few seconds and seems provoked with stress, and going for a walk helps it resolve.She denies radiation of chest pain or diaphoresis. She also gets numbness around her lips off and on, but this is not new. She denies SOB, diaphoresis. 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 She has watched [employer] with similar symptoms and is concerned about having a heart problem, and is afraid to  go ER. She has had 2 of this episodes today, and with one of them she felt very cold. I reviewed with her in detail cardiac symptoms vs anxiety symptoms, and I told her that sounds she is having more anxiety. She still insisted she wanted to be seen, but unsure whether to go to Urgent care of ER. I told her in the ER she would be able to have cardiac enzymes compared to not being able to have this at the urgent care. I will follow up with her next week over the phone, and if cardiac work up is neg, I may try her on buspiradone for anxiety. All her questions were answered.

## 2018-12-13 ENCOUNTER — Other Ambulatory Visit: Payer: Self-pay

## 2018-12-13 DIAGNOSIS — Z20822 Contact with and (suspected) exposure to covid-19: Secondary | ICD-10-CM

## 2018-12-14 LAB — NOVEL CORONAVIRUS, NAA: SARS-CoV-2, NAA: NOT DETECTED

## 2018-12-19 ENCOUNTER — Telehealth: Payer: Self-pay | Admitting: General Practice

## 2018-12-19 NOTE — Telephone Encounter (Signed)
Negative COVID results given. Patient results "NOT Detected." Caller expressed understanding. ° °

## 2018-12-20 ENCOUNTER — Ambulatory Visit: Payer: Self-pay | Admitting: Internal Medicine

## 2018-12-22 ENCOUNTER — Telehealth: Payer: Self-pay | Admitting: Cardiovascular Disease

## 2018-12-22 ENCOUNTER — Encounter: Payer: Self-pay | Admitting: Internal Medicine

## 2018-12-22 ENCOUNTER — Other Ambulatory Visit: Payer: Self-pay

## 2018-12-22 ENCOUNTER — Ambulatory Visit: Payer: Self-pay | Admitting: Internal Medicine

## 2018-12-22 VITALS — BP 110/60 | HR 62 | Temp 98.3°F | Ht 60.8 in | Wt 139.4 lb

## 2018-12-22 DIAGNOSIS — F419 Anxiety disorder, unspecified: Secondary | ICD-10-CM

## 2018-12-22 DIAGNOSIS — E782 Mixed hyperlipidemia: Secondary | ICD-10-CM | POA: Insufficient documentation

## 2018-12-22 DIAGNOSIS — R0789 Other chest pain: Secondary | ICD-10-CM | POA: Insufficient documentation

## 2018-12-22 DIAGNOSIS — R001 Bradycardia, unspecified: Secondary | ICD-10-CM

## 2018-12-22 NOTE — Telephone Encounter (Signed)
LVM for patient to call and schedule new patient appointment.

## 2018-12-22 NOTE — Progress Notes (Signed)
Subjective:     Patient ID: Brittany Huber , female    DOB: [birth year] , 48 y.o.   MRN: [id number]   Chief Complaint  Patient presents with  . Hyperlipidemia    HPI  Here for cholesterol check up. Has been watching her diet to help it bring down.  Has been getting trap tension and chest pressure with walking hills off and on for the past year. We had talked about this on the phone and I asked her to pay attention if related to stress, of if she still had it when not stressed and climbed hills. She describes the discomfort as pressure feeling lasting a few minutes, sometimes goes away on its own, other times with walking.  She noticed using CBD oil has helped with her sleep and insomnia and anxiety, but the chest pressure is still present.  PMhx- hyperlipidemia             Anxiety  Family History  Problem Relation Age of Onset  . Cancer Maternal Grandmother        uterine cancer  . Hypertension Mother     No current outpatient medications on file.   No Known Allergies   Review of Systems  Gets easily SOB when  She climes steps. And has chest pressure.  Has itching of both eyes. She has itching of both eyes. Denies appetite changes, weight gain or loss, no bowel changes. Has trouble with insomnia when she feels stressed 1-2 times a week. It takes her 3-4 hours to fall asleep. Does not think she is stressed, but in the past was placed on medication for depression which helped her anxiety around 2005 and also was dealing with downs baby.  Has been taking CBD drops and has helped her go to sleep and her mood and anxiety is better. She is just concerned to become addicted to this oil. Denies GU or GU symptoms.  Today's Vitals   12/22/18 0852  BP: 110/60  Pulse: 62  Temp: 98.3 F (36.8 C)  TempSrc: Oral  SpO2: 98%  Weight: 139 lb 6.4 oz (63.2 kg)  Height: 5' 0.8" (1.544 m)   Body mass index is 26.51 kg/m.   Objective:  Physical Exam  Wt is up 2 lbs Constitutional: She  is oriented to person, place, and time. She appears well-developed and well-nourished. No distress.  HENT:  Head: Normocephalic and atraumatic.  Right Ear: External ear normal.  Left Ear: External ear normal.  Nose: Nose normal.  Eyes: Conjunctivae are normal. Right eye exhibits no discharge. Left eye exhibits no discharge. No scleral icterus.  Neck: Neck supple. No thyromegaly present.  No carotid bruits bilaterally  Cardiovascular: Normal rate and regular rhythm.  No murmur heard. Pulmonary/Chest: Effort normal and breath sounds normal. No respiratory distress.  Musculoskeletal: Normal range of motion. She exhibits no edema.  Lymphadenopathy:    She has no cervical adenopathy.  Neurological: She is alert and oriented to person, place, and time.  Skin: Skin is warm and dry. Capillary refill takes less than 2 seconds. No rash noted. She is not diaphoretic.  Psychiatric: She has a normal mood and affect. Her behavior is normal. Judgment and thought content normal.  Nursing note reviewed.    EKG- sinus bradycardia Assessment And Plan:    1. Other chest pain- atypical - EKG 12-Lead- shows bradycardia.  - CMP14 + Anion Gap    Referred to cardiology 2. Mixed hyperlipidemia- had been diet control. I will call her  when results are back and if worse will need medication.  - Lipid Profile - TSH - T3, free - T4, Free  3. Anxiety- stable on CBD help oil. May continue this.   4- Bradycardia- new    Ref to cardiology.   Jeremi Losito RODRIGUEZ-SOUTHWORTH, PA-C    THE PATIENT IS ENCOURAGED TO PRACTICE SOCIAL DISTANCING DUE TO THE COVID-19 PANDEMIC.

## 2018-12-23 LAB — CMP14 + ANION GAP
ALT: 21 IU/L (ref 0–32)
AST: 20 IU/L (ref 0–40)
Albumin/Globulin Ratio: 1.8 (ref 1.2–2.2)
Albumin: 4.6 g/dL (ref 3.8–4.8)
Alkaline Phosphatase: 125 IU/L — ABNORMAL HIGH (ref 39–117)
Anion Gap: 12 mmol/L (ref 10.0–18.0)
BUN/Creatinine Ratio: 26 — ABNORMAL HIGH (ref 9–23)
BUN: 16 mg/dL (ref 6–24)
Bilirubin Total: 0.4 mg/dL (ref 0.0–1.2)
CO2: 23 mmol/L (ref 20–29)
Calcium: 10.1 mg/dL (ref 8.7–10.2)
Chloride: 102 mmol/L (ref 96–106)
Creatinine, Ser: 0.62 mg/dL (ref 0.57–1.00)
GFR calc Af Amer: 122 mL/min/{1.73_m2} (ref >59)
GFR calc non Af Amer: 106 mL/min/{1.73_m2} (ref >59)
Globulin, Total: 2.5 g/dL (ref 1.5–4.5)
Glucose: 90 mg/dL (ref 65–99)
Potassium: 4.2 mmol/L (ref 3.5–5.2)
Sodium: 137 mmol/L (ref 134–144)
Total Protein: 7.1 g/dL (ref 6.0–8.5)

## 2018-12-23 LAB — LIPID PANEL
Chol/HDL Ratio: 5.4 ratio — ABNORMAL HIGH (ref 0.0–4.4)
Cholesterol, Total: 290 mg/dL — ABNORMAL HIGH (ref 100–199)
HDL: 54 mg/dL (ref >39)
LDL Calculated: 178 mg/dL — ABNORMAL HIGH (ref 0–99)
Triglycerides: 290 mg/dL — ABNORMAL HIGH (ref 0–149)
VLDL Cholesterol Cal: 58 mg/dL — ABNORMAL HIGH (ref 5–40)

## 2018-12-23 LAB — TSH: TSH: 1.9 u[IU]/mL (ref 0.450–4.500)

## 2018-12-23 LAB — T4, FREE: Free T4: 1.27 ng/dL (ref 0.82–1.77)

## 2018-12-23 LAB — T3, FREE: T3, Free: 3.5 pg/mL (ref 2.0–4.4)

## 2018-12-29 ENCOUNTER — Other Ambulatory Visit: Payer: Self-pay | Admitting: Internal Medicine

## 2018-12-29 DIAGNOSIS — R748 Abnormal levels of other serum enzymes: Secondary | ICD-10-CM

## 2018-12-29 MED ORDER — ATORVASTATIN CALCIUM 20 MG PO TABS: 20.0000 mg | ORAL_TABLET | Freq: Every day | ORAL | 1 refills | Status: DC

## 2018-12-29 NOTE — Progress Notes (Signed)
I spoke with pt and reviewed her labs. Since she has been strict with her diet and her lipids are worse I advised her to start statin and she is willing. Also her Alk phos is elevated and I would like her to have Alk isoenzyme next week and she will come. I also want her to start CoQ10 2 weeks before she starts the Lipitor and continue taking it from then on. If she develops itching, myalgias, nausea or abdominal pain while on it, needs to be seen.

## 2019-01-03 ENCOUNTER — Other Ambulatory Visit: Payer: Self-pay

## 2019-01-03 DIAGNOSIS — R748 Abnormal levels of other serum enzymes: Secondary | ICD-10-CM

## 2019-01-04 LAB — ALKALINE PHOSPHATASE, ISOENZYMES
Alkaline Phosphatase: 124 IU/L — ABNORMAL HIGH (ref 39–117)
BONE FRACTION: 56 % (ref 14–68)
INTESTINAL FRAC.: 13 % (ref 0–18)
LIVER FRACTION: 31 % (ref 18–85)

## 2019-01-15 NOTE — Progress Notes (Signed)
Cardiology Office Note:    Date:  01/17/2019   ID:  Brittany Huber, DOB February 20, 1971, MRN [id number]  PCP:  Brittany Mattocks, PA-C  Cardiologist:  No primary care provider on file.  Electrophysiologist:  None   Referring MD: Brittany Huber, S*   Chief complaint: chest pain  History of Present Illness:    Brittany Huber is a 48 y.o. female with a hx of hyperlipidemia who is referred by Brittany Mattocks, PA-C for an evaluation of chest pain.  Reports that she has chest pain with walking.  Describes left sided pressure.  Going up stairs causes also causes chest pain.  Walks for 30 minutes every day; some days has chest pain, some days does not.  States that it lasts up to 20 minutes and resolves with rest.  Has been occurring for 1 year.  Never smoked.  Denies any family history of heart disease.  Recently started on atorvastatin by her PCP after lipid panel showed LDL 178; however, she states that she did not start the medication, was waiting to be seen here.     Past Medical History:  Diagnosis Date  . Anxiety    controlled with CBD  . Mixed hyperlipidemia     Past Surgical History:  Procedure Laterality Date  . CERVICAL BIOPSY  W/ LOOP ELECTRODE EXCISION    . COLPOSCOPY      Current Medications: Current Meds  Medication Sig  . atorvastatin (LIPITOR) 20 MG tablet Take 1 tablet (20 mg total) by mouth daily.     Allergies:   Patient has no known allergies.   Social History   Socioeconomic History  . Marital status: Single    Spouse name: Not on file  . Number of children: Not on file  . Years of education: Not on file  . Highest education level: Not on file  Occupational History  . Not on file  Social Needs  . Financial resource strain: Not on file  . Food insecurity    Worry: Not on file    Inability: Not on file  . Transportation needs    Medical: Not on file    Non-medical: Not on file  Tobacco Use  . Smoking status: Never  Smoker  . Smokeless tobacco: Never Used  Substance and Sexual Activity  . Alcohol use: No  . Drug use: No  . Sexual activity: Yes    Birth control/protection: None, Condom  Lifestyle  . Physical activity    Days per week: 0 days    Minutes per session: 0 min  . Stress: Only a little  Relationships  . Social Herbalist on phone: Twice a week    Gets together: Never    Attends religious service: More than 4 times per year    Active member of club or organization: No    Attends meetings of clubs or organizations: Never    Relationship status: Living with partner  Other Topics Concern  . Not on file  Social History Narrative  . Not on file     Family History: The patient's family history includes Cancer in her maternal grandmother; Hypertension in her mother. No family history of heart disease.  ROS:   Please see the history of present illness.    All other systems reviewed and are negative.  EKGs/Labs/Other Studies Reviewed:    The following studies were reviewed today:   EKG:  EKG is ordered today.  The ekg ordered today demonstrates NSR, rate 61  bpm, low voltage in precordial leads  Recent Labs: 06/16/2018: Hemoglobin 14.3; Platelets 269 12/22/2018: ALT 21; BUN 16; Creatinine, Ser 0.62; Potassium 4.2; Sodium 137; TSH 1.900  Recent Lipid Panel    Component Value Date/Time   CHOL 290 (H) 12/22/2018 0939   TRIG 290 (H) 12/22/2018 0939   HDL 54 12/22/2018 0939   CHOLHDL 5.4 (H) 12/22/2018 0939   CHOLHDL 2.9 01/19/2014 1116   VLDL 14 01/19/2014 1116   LDLCALC 178 (H) 12/22/2018 0939    Physical Exam:    VS:  BP 112/64 (BP Location: Left Arm)   Pulse 61   Ht 5\' 4"  (1.626 m)   Wt 141 lb 3.2 oz (64 kg)   SpO2 98%   BMI 24.24 kg/m     Wt Readings from Last 3 Encounters:  01/17/19 141 lb 3.2 oz (64 kg)  12/22/18 139 lb 6.4 oz (63.2 kg)  06/16/18 137 lb 9.6 oz (62.4 kg)     GEN: Well nourished, well developed in no acute distress HEENT: Normal  NECK: No JVD; No carotid bruits LYMPHATICS: No lymphadenopathy CARDIAC:RRR, no murmurs, rubs, gallops RESPIRATORY:  Clear to auscultation without rales, wheezing or rhonchi  ABDOMEN: Soft, non-tender, non-distended MUSCULOSKELETAL:  No edema; No deformity  SKIN: Warm and dry NEUROLOGIC:  Alert and oriented x 3 PSYCHIATRIC:  Normal affect   ASSESSMENT:    1. Precordial pain   2. Hyperlipidemia, unspecified hyperlipidemia type    PLAN:    In order of problems listed above:  Chest pain: description concerning for stable angina.   Risk factors include HLD. - Coronary CTA  Hyperlipidemia:  Last LDL 178 on 12/22/2018.  ASCVD 10-year risk 1.5%, so would not meet criteria for starting a statin unless evidence of CAD on coronary CTA as above   Medication Adjustments/Labs and Tests Ordered: Current medicines are reviewed at length with the patient today.  Concerns regarding medicines are outlined above.  Orders Placed This Encounter  Procedures  . CT CORONARY MORPH W/CTA COR W/SCORE W/CA W/CM &/OR WO/CM  . CT CORONARY FRACTIONAL FLOW RESERVE DATA PREP  . CT CORONARY FRACTIONAL FLOW RESERVE FLUID ANALYSIS  . Basic metabolic panel  . EKG 12-Lead   Meds ordered this encounter  Medications  . metoprolol tartrate (LOPRESSOR) 50 MG tablet    Sig: Take 1 tablet (50 mg total) by mouth 2 (two) times daily.    Dispense:  1 tablet    Refill:  0    Patient Instructions  Medication Instructions:  Your physician recommends that you continue on your current medications as directed. Please refer to the Current Medication list given to you today.  If you need a refill on your cardiac medications before your next appointment, please call your pharmacy.   Lab work: BMET If you have labs (blood work) drawn today and your tests are completely normal, you will receive your results only by: [place] (if you have MyChart) OR A paper copy in the mail If you have any lab test that is  abnormal or we need to change your treatment, we will call you to review the results.  Testing/Procedures: Non-Cardiac CT scanning, (CAT scanning), is a noninvasive, special x-ray that produces cross-sectional images of the body using x-rays and a computer. CT scans help physicians diagnose and treat medical conditions. For some CT exams, a contrast material is used to enhance visibility in the area of the body being studied. CT scans provide greater clarity and reveal more details than regular  x-ray exams.    Follow-Up: At [facility], you and your health needs are our priority.  As part of our continuing mission to provide you with exceptional heart care, we have created designated Provider Care Teams.  These Care Teams include your primary Cardiologist (physician) and Advanced Practice Providers (APPs -  Physician Assistants and Nurse Practitioners) who all work together to provide you with the care you need, when you need it.  Please follow up in 6 months with Dr. Gardiner Rhyme. We will reach out to you 2 months prior to appointment.   Any Other Special Instructions Will Be Listed Below (If Applicable).   Your cardiac CT will be scheduled at one of the below locations:   [facility] [address] [place], [place] [address] (336) [place] [address] [place], [place] [address] [phone]  If scheduled at [facility], please arrive at the [facility] main entrance of [facility] 30-45 minutes prior to test start time. Proceed to the [facility] Radiology Department (first floor) to check-in and test prep.  If scheduled at [facility], please arrive 15 mins early for check-in and test prep.  Please follow these instructions carefully (unless otherwise directed):  Hold all erectile dysfunction medications at least 3 days (72 hrs) prior to test.  On the Night  Before the Test: . Be sure to Drink plenty of water. . Do not consume any caffeinated/decaffeinated beverages or chocolate 12 hours prior to your test.  On the Day of the Test: . Drink plenty of water. Do not drink any water within one hour of the test. . Do not eat any food 4 hours prior to the test. . You may take your regular medications prior to the test.  . Take 50mg  metoprolol (Lopressor) two hours prior to test. . FEMALES- please wear underwire-free bra if available       After the Test: . Drink plenty of water. . After receiving IV contrast, you may experience a mild flushed feeling. This is normal. . On occasion, you may experience a mild rash up to 24 hours after the test. This is not dangerous. If this occurs, you can take Benadryl 25 mg and increase your fluid intake. . If you experience trouble breathing, this can be serious. If it is severe call 911 IMMEDIATELY. If it is mild, please call our office. . If you take any of these medications: Glipizide/Metformin, Avandament, Glucavance, please do not take 48 hours after completing test unless otherwise instructed.    Please contact the cardiac imaging nurse navigator should you have any questions/concerns Marchia Bond, RN Navigator Cardiac Imaging [facility] V[facility] Services [phone] Office  [phone] Cell          Signed, Donato Heinz, MD  01/17/2019 8:52 AM    [place]

## 2019-01-17 ENCOUNTER — Ambulatory Visit (INDEPENDENT_AMBULATORY_CARE_PROVIDER_SITE_OTHER): Payer: No Typology Code available for payment source | Admitting: Cardiology

## 2019-01-17 ENCOUNTER — Other Ambulatory Visit: Payer: Self-pay

## 2019-01-17 ENCOUNTER — Encounter: Payer: Self-pay | Admitting: Cardiology

## 2019-01-17 VITALS — BP 112/64 | HR 61 | Ht 64.0 in | Wt 141.2 lb

## 2019-01-17 DIAGNOSIS — E785 Hyperlipidemia, unspecified: Secondary | ICD-10-CM

## 2019-01-17 DIAGNOSIS — R072 Precordial pain: Secondary | ICD-10-CM

## 2019-01-17 MED ORDER — METOPROLOL TARTRATE 50 MG PO TABS: 50.0000 mg | ORAL_TABLET | Freq: Two times a day (BID) | ORAL | 0 refills | Status: DC

## 2019-01-17 MED ORDER — METOPROLOL TARTRATE 50 MG PO TABS: ORAL_TABLET | ORAL | 0 refills | Status: DC

## 2019-01-17 NOTE — Patient Instructions (Addendum)
Medication Instructions:  Your physician recommends that you continue on your current medications as directed. Please refer to the Current Medication list given to you today.  If you need a refill on your cardiac medications before your next appointment, please call your pharmacy.   Lab work: BMET If you have labs (blood work) drawn today and your tests are completely normal, you will receive your results only by: [place] (if you have MyChart) OR A paper copy in the mail If you have any lab test that is abnormal or we need to change your treatment, we will call you to review the results.  Testing/Procedures: Non-Cardiac CT scanning, (CAT scanning), is a noninvasive, special x-ray that produces cross-sectional images of the body using x-rays and a computer. CT scans help physicians diagnose and treat medical conditions. For some CT exams, a contrast material is used to enhance visibility in the area of the body being studied. CT scans provide greater clarity and reveal more details than regular x-ray exams.    Follow-Up: At [facility], you and your health needs are our priority.  As part of our continuing mission to provide you with exceptional heart care, we have created designated Provider Care Teams.  These Care Teams include your primary Cardiologist (physician) and Advanced Practice Providers (APPs -  Physician Assistants and Nurse Practitioners) who all work together to provide you with the care you need, when you need it.  Please follow up in 6 months with Dr. Gardiner Rhyme. We will reach out to you 2 months prior to appointment.   Any Other Special Instructions Will Be Listed Below (If Applicable).   Your cardiac CT will be scheduled at one of the below locations:   [facility] [address] [place], [place] [address] [phone]) [place] [address] [place], [place] [address] [phone]  If  scheduled at [facility], please arrive at the [facility] main entrance of [facility] 30-45 minutes prior to test start time. Proceed to the [facility] Radiology Department (first floor) to check-in and test prep.  If scheduled at [facility], please arrive 15 mins early for check-in and test prep.  Please follow these instructions carefully (unless otherwise directed):  Hold all erectile dysfunction medications at least 3 days (72 hrs) prior to test.  On the Night Before the Test: . Be sure to Drink plenty of water. . Do not consume any caffeinated/decaffeinated beverages or chocolate 12 hours prior to your test.  On the Day of the Test: . Drink plenty of water. Do not drink any water within one hour of the test. . Do not eat any food 4 hours prior to the test. . You may take your regular medications prior to the test.  . Take 50mg  metoprolol (Lopressor) two hours prior to test. . FEMALES- please wear underwire-free bra if available       After the Test: . Drink plenty of water. . After receiving IV contrast, you may experience a mild flushed feeling. This is normal. . On occasion, you may experience a mild rash up to 24 hours after the test. This is not dangerous. If this occurs, you can take Benadryl 25 mg and increase your fluid intake. . If you experience trouble breathing, this can be serious. If it is severe call 911 IMMEDIATELY. If it is mild, please call our office. . If you take any of these medications: Glipizide/Metformin,  Avandament, Glucavance, please do not take 48 hours after completing test unless otherwise instructed.    Please contact the cardiac imaging nurse navigator should you have any questions/concerns Marchia Bond, RN Navigator Cardiac Imaging [facility] [phone] Office  [phone] Cell      Cardiac CT Angiogram  A cardiac CT angiogram is a procedure to look at the heart and  the area around the heart. It may be done to help find the cause of chest pains or other symptoms of heart disease. During this procedure, a large X-ray machine, called a CT scanner, takes detailed pictures of the heart and the surrounding area after a dye (contrast material) has been injected into blood vessels in the area. The procedure is also sometimes called a coronary CT angiogram, coronary artery scanning, or CTA. A cardiac CT angiogram allows the health care provider to see how well blood is flowing to and from the heart. The health care provider will be able to see if there are any problems, such as:  Blockage or narrowing of the coronary arteries in the heart.  Fluid around the heart.  Signs of weakness or disease in the muscles, valves, and tissues of the heart. Tell a health care provider about:  Any allergies you have. This is especially important if you have had a previous allergic reaction to contrast dye.  All medicines you are taking, including vitamins, herbs, eye drops, creams, and over-the-counter medicines.  Any blood disorders you have.  Any surgeries you have had.  Any medical conditions you have.  Whether you are pregnant or may be pregnant.  Any anxiety disorders, chronic pain, or other conditions you have that may increase your stress or prevent you from lying still. What are the risks? Generally, this is a safe procedure. However, problems may occur, including:  Bleeding.  Infection.  Allergic reactions to medicines or dyes.  Damage to other structures or organs.  Kidney damage from the dye or contrast that is used.  Increased risk of cancer from radiation exposure. This risk is low. Talk with your health care provider about: ? The risks and benefits of testing. ? How you can receive the lowest dose of radiation. What happens before the procedure?  Wear comfortable clothing and remove any jewelry, glasses, dentures, and hearing aids.  Follow  instructions from your health care provider about eating and drinking. This may include: ? For 12 hours before the test - avoid caffeine. This includes tea, coffee, soda, energy drinks, and diet pills. Drink plenty of water or other fluids that do not have caffeine in them. Being well-hydrated can prevent complications. ? For 4-6 hours before the test - stop eating and drinking. The contrast dye can cause nausea, but this is less likely if your stomach is empty.  Ask your health care provider about changing or stopping your regular medicines. This is especially important if you are taking diabetes medicines, blood thinners, or medicines to treat erectile dysfunction. What happens during the procedure?  Hair on your chest may need to be removed so that small sticky patches called electrodes can be placed on your chest. These will transmit information that helps to monitor your heart during the test.  An IV tube will be inserted into one of your veins.  You might be given a medicine to control your heart rate during the test. This will help to ensure that good images are obtained.  You will be asked to lie on an exam  table. This table will slide in and out of the CT machine during the procedure.  Contrast dye will be injected into the IV tube. You might feel warm, or you may get a metallic taste in your mouth.  You will be given a medicine (nitroglycerin) to relax (dilate) the arteries in your heart.  The table that you are lying on will move into the CT machine tunnel for the scan.  The person running the machine will give you instructions while the scans are being done. You may be asked to: ? Keep your arms above your head. ? Hold your breath. ? Stay very still, even if the table is moving.  When the scanning is complete, you will be moved out of the machine.  The IV tube will be removed. The procedure may vary among health care providers and hospitals. What happens after the  procedure?  You might feel warm, or you may get a metallic taste in your mouth from the contrast dye.  You may have a headache from the nitroglycerin.  After the procedure, drink water or other fluids to wash (flush) the contrast material out of your body.  Contact a health care provider if you have any symptoms of allergy to the contrast. These symptoms include: ? Shortness of breath. ? Rash or hives. ? A racing heartbeat.  Most people can return to their normal activities right after the procedure. Ask your health care provider what activities are safe for you.  It is up to you to get the results of your procedure. Ask your health care provider, or the department that is doing the procedure, when your results will be ready. Summary  A cardiac CT angiogram is a procedure to look at the heart and the area around the heart. It may be done to help find the cause of chest pains or other symptoms of heart disease.  During this procedure, a large X-ray machine, called a CT scanner, takes detailed pictures of the heart and the surrounding area after a dye (contrast material) has been injected into blood vessels in the area.  Ask your health care provider about changing or stopping your regular medicines before the procedure. This is especially important if you are taking diabetes medicines, blood thinners, or medicines to treat erectile dysfunction.  After the procedure, drink water or other fluids to wash (flush) the contrast material out of your body. This information is not intended to replace advice given to you by your health care provider. Make sure you discuss any questions you have with your health care provider. Document Released: 03/26/2008 Document Revised: 03/26/2017 Document Reviewed: 03/02/2016 Elsevier Patient Education  2020 [employer].

## 2019-01-27 ENCOUNTER — Telehealth (HOSPITAL_COMMUNITY): Payer: Self-pay | Admitting: Emergency Medicine

## 2019-01-27 NOTE — Telephone Encounter (Signed)
Left message on voicemail with name and callback number Shriyan Arakawa RN Navigator Cardiac Imaging  Heart and Vascular Services [phone] Office [phone] Cell  

## 2019-01-30 ENCOUNTER — Ambulatory Visit (HOSPITAL_COMMUNITY)
Admission: RE | Admit: 2019-01-30 | Discharge: 2019-01-30 | Disposition: A | Discharge status: Home / Self Care | Payer: Self-pay | Source: Ambulatory Visit | Attending: Cardiology | Admitting: Cardiology

## 2019-01-30 ENCOUNTER — Other Ambulatory Visit: Payer: Self-pay

## 2019-01-30 DIAGNOSIS — R072 Precordial pain: Secondary | ICD-10-CM

## 2019-01-30 MED ORDER — IOHEXOL 350 MG/ML SOLN
80.0000 mL | Freq: Once | INTRAVENOUS | Status: AC | PRN
  Administered 2019-01-30: 10:00:00 80 mL via INTRAVENOUS

## 2019-01-30 MED ORDER — NITROGLYCERIN 0.4 MG SL SUBL
0.8000 mg | SUBLINGUAL_TABLET | Freq: Once | SUBLINGUAL | Status: AC
  Administered 2019-01-30: 0.8 mg via SUBLINGUAL
  Filled 2019-01-30: qty 25

## 2019-01-30 MED ORDER — NITROGLYCERIN 0.4 MG SL SUBL
SUBLINGUAL_TABLET | SUBLINGUAL | Status: AC
  Filled 2019-01-30: qty 2

## 2019-01-30 NOTE — Progress Notes (Signed)
CT scan completed. Tolerated well. D/C home walking. Awake and alert. In no distress. 

## 2019-06-21 ENCOUNTER — Other Ambulatory Visit: Payer: Self-pay

## 2019-06-21 ENCOUNTER — Encounter: Payer: Self-pay | Admitting: Gastroenterology

## 2019-06-21 ENCOUNTER — Other Ambulatory Visit: Payer: Self-pay | Admitting: Internal Medicine

## 2019-06-21 ENCOUNTER — Encounter: Payer: Self-pay | Admitting: Internal Medicine

## 2019-06-21 ENCOUNTER — Ambulatory Visit (INDEPENDENT_AMBULATORY_CARE_PROVIDER_SITE_OTHER): Payer: Self-pay | Admitting: Internal Medicine

## 2019-06-21 VITALS — BP 116/72 | HR 78 | Temp 98.3°F | Ht 64.0 in | Wt 136.0 lb

## 2019-06-21 DIAGNOSIS — E782 Mixed hyperlipidemia: Secondary | ICD-10-CM

## 2019-06-21 DIAGNOSIS — L290 Pruritus ani: Secondary | ICD-10-CM

## 2019-06-21 DIAGNOSIS — K2900 Acute gastritis without bleeding: Secondary | ICD-10-CM

## 2019-06-21 DIAGNOSIS — Z23 Encounter for immunization: Secondary | ICD-10-CM

## 2019-06-21 DIAGNOSIS — Z1231 Encounter for screening mammogram for malignant neoplasm of breast: Secondary | ICD-10-CM

## 2019-06-21 DIAGNOSIS — Z1211 Encounter for screening for malignant neoplasm of colon: Secondary | ICD-10-CM

## 2019-06-21 DIAGNOSIS — R1011 Right upper quadrant pain: Secondary | ICD-10-CM

## 2019-06-21 MED ORDER — OMEPRAZOLE 20 MG PO CPDR: 20.0000 mg | DELAYED_RELEASE_CAPSULE | Freq: Every day | ORAL | 0 refills | Status: DC

## 2019-06-21 MED ORDER — ATORVASTATIN CALCIUM 20 MG PO TABS: 20.0000 mg | ORAL_TABLET | Freq: Every day | ORAL | 0 refills | Status: DC

## 2019-06-21 NOTE — Patient Instructions (Addendum)
Compre Free Range huevos cafes que sean organicos  No consuma "corn syrup" que causa inflamacion y no es bueno para nuestro cuerpo  Compre " PREPARATION H" crema para el escosor del ano.

## 2019-06-21 NOTE — Progress Notes (Signed)
This visit occurred during the SARS-CoV-2 public health emergency.  Safety protocols were in place, including screening questions prior to the visit, additional usage of staff PPE, and extensive cleaning of exam room while observing appropriate contact time as indicated for disinfecting solutions.  Subjective:     Patient ID: Brittany Huber , female    DOB: [birth year] , 49 y.o.   MRN: [id number]   Chief Complaint  Patient presents with  . Hyperlipidemia    HPI  1-Here for lipid check, but she never started her cholesterol med since it was not at [facility] when she checked. It went to [facility]. She has changed her diet and been drinking green smoothies made at home.  2- She will be turning 49 next month and she is due to have screening colonoscopy. She is willing to have it done, and willing to make payments with the GI group if they allow her.  3-In the past several months , noticed if she eats chilli or caffeine or greasy foods she gets RUQ pain and mild nausea. No SOB or edema. She gets nausea if she eats greasy foods, but not if she consumes spicy or coffee. Past Medical History:  Diagnosis Date  . Anxiety    controlled with CBD  . Mixed hyperlipidemia      Family History  Problem Relation Age of Onset  . Cancer Maternal Grandmother        uterine cancer  . Hypertension Mother      Current Outpatient Medications:  .  atorvastatin (LIPITOR) 20 MG tablet, Take 1 tablet (20 mg total) by mouth daily. (Patient not taking: Reported on 06/21/2019), Disp: 90 tablet, Rfl: 1 .  metoprolol tartrate (LOPRESSOR) 50 MG tablet, 1 tablet by mouth 2 hours prior to CT (Patient not taking: Reported on 06/21/2019), Disp: 1 tablet, Rfl: 0   No Known Allergies   Review of Systems  Denies CP, SOB, urinary symptoms, edema, dizziness. The CP she had in the past has resolved.  Also for years she gets anal itching on occasion, but seems to resolve when she eats healthier and drinks her green smoothies.  Denies change in bowel habits or blood in her stool.  Today's Vitals   06/21/19 1212  BP: 116/72  Pulse: 78  Temp: 98.3 F (36.8 C)  TempSrc: Oral  SpO2: 94%  Weight: 136 lb (61.7 kg)  Height: 5\' 4"  (1.626 m)   Body mass index is 23.34 kg/m.   Objective:  Physical Exam Vitals reviewed.  Constitutional:      General: She is not in acute distress.    Appearance: Normal appearance.  HENT:     Head: Atraumatic.     Right Ear: External ear normal.     Left Ear: External ear normal.  Eyes:     General: No scleral icterus.    Extraocular Movements: Extraocular movements intact.     Conjunctiva/sclera: Conjunctivae normal.  Cardiovascular:     Rate and Rhythm: Normal rate and regular rhythm.  Abdominal:     General: Bowel sounds are normal. There is no distension.     Palpations: Abdomen is soft. There is no mass.     Tenderness: There is abdominal tenderness. There is no guarding or rebound.     Comments: Has moderate tenderness on RUQ and mild on epigastric region.   Musculoskeletal:        General: Normal range of motion.     Cervical back: Neck supple.  Skin:  General: Skin is warm and dry.  Neurological:     Mental Status: She is alert and oriented to person, place, and time.     Gait: Gait normal.  Psychiatric:        Mood and Affect: Mood normal.        Behavior: Behavior normal.        Thought Content: Thought content normal.        Judgment: Judgment normal.     Assessment And Plan:     1. Mixed hyperlipidemia- chronic.      She is to continue with her green smoothies and low fat diet. She wanted to go ahead and have me check her lipids even though she has not started the Lipitor. I will call her when the results are back.  - Lipid Profile - CMP14 + Anion Gap  2. Screen for colon cancer- due after March 7th.  - Ambulatory referral to Gastroenterology  3. Other acute gastritis without hemorrhage      I will have her try Prilosec 20 mg qd x 4 weeks. Fu  in 4 weeks.   4. Right upper quadrant pain     May end up needing GB US if the Prilosec does not help.   5. Anal itching     Advised to try preparation H cream   Sweet Brittany RODRIGUEZ-SOUTHWORTH, PA-C    THE PATIENT IS ENCOURAGED TO PRACTICE SOCIAL DISTANCING DUE TO THE COVID-19 PANDEMIC.

## 2019-06-22 ENCOUNTER — Ambulatory Visit: Payer: Self-pay | Admitting: Internal Medicine

## 2019-06-22 LAB — CMP14 + ANION GAP
ALT: 18 IU/L (ref 0–32)
AST: 18 IU/L (ref 0–40)
Albumin/Globulin Ratio: 1.5 (ref 1.2–2.2)
Albumin: 4.3 g/dL (ref 3.8–4.8)
Alkaline Phosphatase: 114 IU/L (ref 39–117)
Anion Gap: 15 mmol/L (ref 10.0–18.0)
BUN/Creatinine Ratio: 22 (ref 9–23)
BUN: 13 mg/dL (ref 6–24)
Bilirubin Total: 0.3 mg/dL (ref 0.0–1.2)
CO2: 23 mmol/L (ref 20–29)
Calcium: 9.8 mg/dL (ref 8.7–10.2)
Chloride: 102 mmol/L (ref 96–106)
Creatinine, Ser: 0.58 mg/dL (ref 0.57–1.00)
GFR calc Af Amer: 125 mL/min/{1.73_m2} (ref >59)
GFR calc non Af Amer: 109 mL/min/{1.73_m2} (ref >59)
Globulin, Total: 2.8 g/dL (ref 1.5–4.5)
Glucose: 85 mg/dL (ref 65–99)
Potassium: 4.5 mmol/L (ref 3.5–5.2)
Sodium: 140 mmol/L (ref 134–144)
Total Protein: 7.1 g/dL (ref 6.0–8.5)

## 2019-06-22 LAB — LIPID PANEL
Chol/HDL Ratio: 5.1 ratio — ABNORMAL HIGH (ref 0.0–4.4)
Cholesterol, Total: 303 mg/dL — ABNORMAL HIGH (ref 100–199)
HDL: 60 mg/dL (ref >39)
LDL Chol Calc (NIH): 202 mg/dL — ABNORMAL HIGH (ref 0–99)
Triglycerides: 213 mg/dL — ABNORMAL HIGH (ref 0–149)
VLDL Cholesterol Cal: 41 mg/dL — ABNORMAL HIGH (ref 5–40)

## 2019-07-21 ENCOUNTER — Other Ambulatory Visit: Payer: Self-pay

## 2019-07-21 ENCOUNTER — Ambulatory Visit (AMBULATORY_SURGERY_CENTER): Payer: Self-pay | Admitting: *Deleted

## 2019-07-21 VITALS — Temp 96.7°F | Ht 61.0 in | Wt 139.0 lb

## 2019-07-21 DIAGNOSIS — Z1211 Encounter for screening for malignant neoplasm of colon: Secondary | ICD-10-CM

## 2019-07-21 NOTE — Progress Notes (Addendum)
Pt assisted by Evelena Peat from scheduling with Spanish interpretation .   Interpretor has been requested for day of procedure.

## 2019-07-21 NOTE — Progress Notes (Signed)

## 2019-08-01 ENCOUNTER — Ambulatory Visit
Admission: RE | Admit: 2019-08-01 | Discharge: 2019-08-01 | Disposition: A | Discharge status: Home / Self Care | Payer: No Typology Code available for payment source | Source: Ambulatory Visit | Attending: Internal Medicine | Admitting: Internal Medicine

## 2019-08-01 ENCOUNTER — Other Ambulatory Visit: Payer: Self-pay

## 2019-08-01 DIAGNOSIS — Z1231 Encounter for screening mammogram for malignant neoplasm of breast: Secondary | ICD-10-CM

## 2019-08-02 ENCOUNTER — Encounter: Payer: Self-pay | Admitting: General Practice

## 2019-08-04 ENCOUNTER — Encounter: Payer: Self-pay | Admitting: Gastroenterology

## 2019-08-24 ENCOUNTER — Encounter: Payer: Self-pay | Admitting: Internal Medicine

## 2019-08-24 ENCOUNTER — Other Ambulatory Visit: Payer: Self-pay

## 2019-08-24 ENCOUNTER — Ambulatory Visit (INDEPENDENT_AMBULATORY_CARE_PROVIDER_SITE_OTHER): Payer: Self-pay | Admitting: Internal Medicine

## 2019-08-24 VITALS — BP 120/68 | HR 61 | Temp 98.3°F | Ht 60.6 in | Wt 139.8 lb

## 2019-08-24 DIAGNOSIS — E782 Mixed hyperlipidemia: Secondary | ICD-10-CM

## 2019-08-24 DIAGNOSIS — R1010 Upper abdominal pain, unspecified: Secondary | ICD-10-CM

## 2019-08-24 NOTE — Progress Notes (Signed)
This visit occurred during the SARS-CoV-2 public health emergency.  Safety protocols were in place, including screening questions prior to the visit, additional usage of staff PPE, and extensive cleaning of exam room while observing appropriate contact time as indicated for disinfecting solutions.  Subjective:     Patient ID: Brittany Huber , female    DOB: July 21, 1970 , 49 y.o.   MRN: [id number]   Chief Complaint  Patient presents with  . Hyperlipidemia    HPI Pt is here for recheck cholesterol. Has been exercising qd. And avoiding fried foods. Walks 30-60 minutes qd  Also Fu trial of Prilosec for upper abdominal pain and  has not helped. Pain is provoked when she eats greasy foods, which is rare. Has awaken her at night time.        Past Medical History:  Diagnosis Date  . Anxiety    controlled with CBD  . Mixed hyperlipidemia      Family History  Problem Relation Age of Onset  . Cancer Maternal Grandmother        uterine cancer  . Hypertension Mother   . Colon cancer Neg Hx   . Esophageal cancer Neg Hx   . Stomach cancer Neg Hx   . Rectal cancer Neg Hx      Current Outpatient Medications:  .  atorvastatin (LIPITOR) 20 MG tablet, Take 1 tablet (20 mg total) by mouth daily., Disp: 90 tablet, Rfl: 0 .  ferrous sulfate 325 (65 FE) MG tablet, Take 325 mg by mouth daily with breakfast., Disp: , Rfl:  .  KRILL OIL PO, Take 500 mg by mouth daily., Disp: , Rfl:  .  Loratadine (CLARITIN) 10 MG CAPS, Take 10 mg by mouth daily as needed., Disp: , Rfl:  .  Polyethylene Glycol 3350 (MIRALAX PO), Take 238 g by mouth once. Colonoscopy bowel prep, Disp: , Rfl:  .  omeprazole (PRILOSEC) 20 MG capsule, Take 1 capsule (20 mg total) by mouth daily., Disp: 30 capsule, Rfl: 0   No Known Allergies   Review of Systems  + for RUQ pain, occasional insomnia. Denies CP, SOB, fever. Has had chills off and on x 1 month.  Today's Vitals   08/24/19 0957  BP: 120/68  Pulse: 61  Temp: 98.3  F (36.8 C)  TempSrc: Oral  Weight: 139 lb 12.8 oz (63.4 kg)  Height: 5' 0.6" (1.539 m)   Body mass index is 26.76 kg/m.   Objective:  Physical Exam Vitals and nursing note reviewed.  Constitutional:      General: She is not in acute distress.    Appearance: Normal appearance. She is not toxic-appearing.  HENT:     Right Ear: External ear normal.     Left Ear: External ear normal.  Eyes:     General: No scleral icterus.    Conjunctiva/sclera: Conjunctivae normal.  Cardiovascular:     Rate and Rhythm: Normal rate and regular rhythm.  Pulmonary:     Effort: Pulmonary effort is normal.     Breath sounds: Normal breath sounds.  Abdominal:     General: Bowel sounds are normal. There is distension.     Palpations: Abdomen is soft. There is no mass.     Tenderness: There is abdominal tenderness. There is no guarding or rebound.     Hernia: No hernia is present.     Comments: Has moderate tenderness on RUQ  Musculoskeletal:        General: Normal range of motion.  Cervical back: Normal range of motion and neck supple.  Skin:    General: Skin is warm and dry.     Findings: No rash.  Neurological:     Mental Status: She is alert and oriented to person, place, and time.     Gait: Gait normal.  Psychiatric:        Mood and Affect: Mood normal.        Behavior: Behavior normal.        Thought Content: Thought content normal.        Judgment: Judgment normal.     Assessment And Plan:    1. Upper abdominal pain- I am hoping we can have her have this test before her endoscopy on Monday, that way if neg, she can have an endoscopy with her screen colonoscopy and have to pay for sedation once since she is cash pay.  - US Abdomen Complete; Future  2. Mixed hyperlipidemia- chronic. Diet controlled. If not improved I may have to place her on medication.  - Lipid Profile - CMP14 + Anion Gap    Vida Nicol RODRIGUEZ-SOUTHWORTH, PA-C    THE PATIENT IS ENCOURAGED TO PRACTICE SOCIAL  DISTANCING DUE TO THE COVID-19 PANDEMIC.

## 2019-08-25 ENCOUNTER — Ambulatory Visit (HOSPITAL_COMMUNITY)
Admission: RE | Admit: 2019-08-25 | Discharge: 2019-08-25 | Disposition: A | Discharge status: Home / Self Care | Payer: Self-pay | Source: Ambulatory Visit | Attending: Internal Medicine | Admitting: Internal Medicine

## 2019-08-25 DIAGNOSIS — R1010 Upper abdominal pain, unspecified: Secondary | ICD-10-CM

## 2019-08-25 LAB — CMP14 + ANION GAP
ALT: 19 IU/L (ref 0–32)
AST: 17 IU/L (ref 0–40)
Albumin/Globulin Ratio: 1.8 (ref 1.2–2.2)
Albumin: 4.5 g/dL (ref 3.8–4.8)
Alkaline Phosphatase: 121 IU/L — ABNORMAL HIGH (ref 39–117)
Anion Gap: 15 mmol/L (ref 10.0–18.0)
BUN/Creatinine Ratio: 23 (ref 9–23)
BUN: 14 mg/dL (ref 6–24)
Bilirubin Total: 0.5 mg/dL (ref 0.0–1.2)
CO2: 21 mmol/L (ref 20–29)
Calcium: 9.7 mg/dL (ref 8.7–10.2)
Chloride: 104 mmol/L (ref 96–106)
Creatinine, Ser: 0.6 mg/dL (ref 0.57–1.00)
GFR calc Af Amer: 123 mL/min/{1.73_m2} (ref >59)
GFR calc non Af Amer: 107 mL/min/{1.73_m2} (ref >59)
Globulin, Total: 2.5 g/dL (ref 1.5–4.5)
Glucose: 90 mg/dL (ref 65–99)
Potassium: 4.3 mmol/L (ref 3.5–5.2)
Sodium: 140 mmol/L (ref 134–144)
Total Protein: 7 g/dL (ref 6.0–8.5)

## 2019-08-25 LAB — LIPID PANEL
Chol/HDL Ratio: 2.6 ratio (ref 0.0–4.4)
Cholesterol, Total: 153 mg/dL (ref 100–199)
HDL: 60 mg/dL (ref >39)
LDL Chol Calc (NIH): 76 mg/dL (ref 0–99)
Triglycerides: 91 mg/dL (ref 0–149)
VLDL Cholesterol Cal: 17 mg/dL (ref 5–40)

## 2019-08-28 ENCOUNTER — Ambulatory Visit (AMBULATORY_SURGERY_CENTER): Payer: Self-pay | Admitting: Gastroenterology

## 2019-08-28 ENCOUNTER — Encounter: Payer: Self-pay | Admitting: Gastroenterology

## 2019-08-28 ENCOUNTER — Telehealth: Payer: Self-pay | Admitting: *Deleted

## 2019-08-28 ENCOUNTER — Other Ambulatory Visit: Payer: Self-pay

## 2019-08-28 VITALS — BP 117/70 | HR 57 | Temp 96.5°F | Resp 14 | Ht 64.0 in | Wt 139.0 lb

## 2019-08-28 DIAGNOSIS — Z1211 Encounter for screening for malignant neoplasm of colon: Secondary | ICD-10-CM

## 2019-08-28 DIAGNOSIS — B9681 Helicobacter pylori [H. pylori] as the cause of diseases classified elsewhere: Secondary | ICD-10-CM

## 2019-08-28 DIAGNOSIS — K2990 Gastroduodenitis, unspecified, without bleeding: Secondary | ICD-10-CM

## 2019-08-28 DIAGNOSIS — R1013 Epigastric pain: Secondary | ICD-10-CM

## 2019-08-28 DIAGNOSIS — K2970 Gastritis, unspecified, without bleeding: Secondary | ICD-10-CM

## 2019-08-28 DIAGNOSIS — D120 Benign neoplasm of cecum: Secondary | ICD-10-CM

## 2019-08-28 MED ORDER — SODIUM CHLORIDE 0.9 % IV SOLN: 500.0000 mL | Freq: Once | INTRAVENOUS | Status: DC

## 2019-08-28 NOTE — Progress Notes (Signed)
Called to room to assist during endoscopic procedure.  Patient ID and intended procedure confirmed with present staff. Received instructions for my participation in the procedure from the performing physician.  

## 2019-08-28 NOTE — Progress Notes (Signed)
Spoke with pt and she is aware that she will be charged for a double procedure- she voices understanding and is agreeable to doing both the EGD and colonoscopy today. Consent updated.

## 2019-08-28 NOTE — Telephone Encounter (Signed)
My name is Audery Amel and I dont know who is doing Carnelia's colonoscopy, but due to having persistent abdominal pain, having a negative GB US 2 days ago, I beleive she needs to have an endoscopy and wonder if you would please send this to the physician who is doing her colonoscopy. Since she is paying cash, I am hoping she can save some money and not to have to come back for an endoscopy and have to pay for antoher anethesia bill.   I received this last  night from this patient's PA and she is scheduled for a pre-visit today.  Please advise .

## 2019-08-28 NOTE — Op Note (Signed)
[facility] Patient Name: Brittany Huber Procedure Date: 08/28/2019 8:30 AM MRN: [id number] Endoscopist: Mauri Pole , MD Age: 49 Referring MD:  Date of Birth: [birth year] Gender: Female Account #: [id number] Procedure:                Upper GI endoscopy Indications:              Epigastric abdominal pain Medicines:                Monitored Anesthesia Care Procedure:                Pre-Anesthesia Assessment:                           - Prior to the procedure, a History and Physical                            was performed, and patient medications and                            allergies were reviewed. The patient's tolerance of                            previous anesthesia was also reviewed. The risks                            and benefits of the procedure and the sedation                            options and risks were discussed with the patient.                            All questions were answered, and informed consent                            was obtained. Prior Anticoagulants: The patient has                            taken no previous anticoagulant or antiplatelet                            agents. ASA Grade Assessment: II - A patient with                            mild systemic disease. After reviewing the risks                            and benefits, the patient was deemed in                            satisfactory condition to undergo the procedure.                           After obtaining informed consent, the endoscope was  passed under direct vision. Throughout the                            procedure, the patient's blood pressure, pulse, and                            oxygen saturations were monitored continuously. The                            Endoscope was introduced through the mouth, and                            advanced to the second part of duodenum. The upper                            GI endoscopy was  accomplished without difficulty.                            The patient tolerated the procedure well. Scope In: Scope Out: Findings:                 The Z-line was regular and was found 35 cm from the                            incisors.                           The examined esophagus was normal.                           Scattered mild inflammation characterized by                            congestion (edema), erythema and mucus was found in                            the entire examined stomach. Biopsies were taken                            with a cold forceps for Helicobacter pylori testing.                           The examined duodenum was normal. Complications:            No immediate complications. Estimated Blood Loss:     Estimated blood loss was minimal. Impression:               - Z-line regular, 35 cm from the incisors.                           - Normal esophagus.                           - Gastritis. Biopsied.                           -  Normal examined duodenum. Recommendation:           - Patient has a contact number available for                            emergencies. The signs and symptoms of potential                            delayed complications were discussed with the                            patient. Return to normal activities tomorrow.                            Written discharge instructions were provided to the                            patient.                           - Resume previous diet.                           - Continue present medications.                           - Await pathology results. Mauri Pole, MD 08/28/2019 9:02:05 AM This report has been signed electronically.

## 2019-08-28 NOTE — Progress Notes (Signed)
A/ox3, pleased with MAC, report to RN 

## 2019-08-28 NOTE — Progress Notes (Signed)
KA vitals, JB temp and SB IV.  Interpreter Johnsie Cancel helped with admitting.

## 2019-08-28 NOTE — Patient Instructions (Addendum)
YOU HAD AN ENDOSCOPIC PROCEDURE TODAY AT [facility]:   Refer to the procedure report that was given to you for any specific questions about what was found during the examination.  If the procedure report does not answer your questions, please call your gastroenterologist to clarify.  If you requested that your care partner not be given the details of your procedure findings, then the procedure report has been included in a sealed envelope for you to review at your convenience later.  YOU SHOULD EXPECT: Some feelings of bloating in the abdomen. Passage of more gas than usual.  Walking can help get rid of the air that was put into your GI tract during the procedure and reduce the bloating. If you had a lower endoscopy (such as a colonoscopy or flexible sigmoidoscopy) you may notice spotting of blood in your stool or on the toilet paper. If you underwent a bowel prep for your procedure, you may not have a normal bowel movement for a few days.  Please Note:  You might notice some irritation and congestion in your nose or some drainage.  This is from the oxygen used during your procedure.  There is no need for concern and it should clear up in a day or so.  SYMPTOMS TO REPORT IMMEDIATELY:   Following lower endoscopy (colonoscopy or flexible sigmoidoscopy):  Excessive amounts of blood in the stool  Significant tenderness or worsening of abdominal pains  Swelling of the abdomen that is new, acute  Fever of 100F or higher   Following upper endoscopy (EGD)  Vomiting of blood or coffee ground material  New chest pain or pain under the shoulder blades  Painful or persistently difficult swallowing  New shortness of breath  Fever of 100F or higher  Black, tarry-looking stools  For urgent or emergent issues, a gastroenterologist can be reached at any hour by calling [phone]. Do not use MyChart messaging for urgent concerns.    DIET:  We do recommend a small meal at first, but  then you may proceed to your regular diet.  Drink plenty of fluids but you should avoid alcoholic beverages for 24 hours.  ACTIVITY:  You should plan to take it easy for the rest of today and you should NOT DRIVE or use heavy machinery until tomorrow (because of the sedation medicines used during the test).    FOLLOW UP: Our staff will call the number listed on your records 48-72 hours following your procedure to check on you and address any questions or concerns that you may have regarding the information given to you following your procedure. If we do not reach you, we will leave a message.  We will attempt to reach you two times.  During this call, we will ask if you have developed any symptoms of COVID 19. If you develop any symptoms (ie: fever, flu-like symptoms, shortness of breath, cough etc.) before then, please call [phone].  If you test positive for Covid 19 in the 2 weeks post procedure, please call and report this information to [place].    If any biopsies were taken you will be contacted by phone or by letter within the next 1-3 weeks.  Please call us at [phone] if you have not heard about the biopsies in 3 weeks.    SIGNATURES/CONFIDENTIALITY: You and/or your care partner have signed paperwork which will be entered into your electronic medical record.  These signatures attest to the fact that that the information above on  your After Visit Summary has been reviewed and is understood.  Full responsibility of the confidentiality of this discharge information lies with you and/or your care-pUSTED TUVO UN PROCEDIMIENTO ENDOSCPICO [place]:   Lea el informe del procedimiento que se le entreg para cualquier pregunta especfica sobre lo que se Primary school teacher.  Si el informe del examen no responde a sus preguntas, por favor llame a su gastroenterlogo para aclararlo.  Si usted solicit que no se le den [employer] de lo que se Estate manager/land agent en su procedimiento  al [employer] va a cuidar, entonces el informe del procedimiento se ha incluido en un sobre sellado para que usted lo revise despus cuando le sea ms conveniente.   LO QUE PUEDE ESPERAR: Algunas sensaciones de hinchazn en el abdomen.  Puede tener ms gases de lo normal.  El caminar puede ayudarle a eliminar el aire que se le puso en el tracto gastrointestinal durante el procedimiento y reducir la hinchazn.  Si le hicieron una endoscopia inferior (como una colonoscopia o una sigmoidoscopia flexible), podra notar manchas de sangre en las heces fecales o en el papel higinico.  Si se someti a una preparacin intestinal para su procedimiento, es posible que no tenga una evacuacin intestinal normal durante [employer].   Tenga en cuenta:  Es posible que note un poco de irritacin y congestin en la nariz o algn drenaje.  Esto es debido al oxgeno [employer] durante su procedimiento.  No hay que preocuparse y esto debe desaparecer ms o Scientist, research (medical).   SNTOMAS PARA REPORTAR INMEDIATAMENTE:  Despus de una endoscopia inferior (colonoscopia o sigmoidoscopia flexible):  Cantidades excesivas de sangre en las heces fecales  Sensibilidad significativa o empeoramiento de los dolores abdominales   Hinchazn aguda del abdomen que antes no tena   Fiebre de 100F o ms   Despus de la endoscopia superior (EGD)  Vmitos de Biochemist, clinical o material como caf molido   Dolor en el pecho o dolor debajo de los omplatos que antes no tena   Dolor o dificultad persistente para tragar  Falta de aire que antes no tena   Fiebre de 100F o ms  Heces fecales negras y pegajosas   Para asuntos urgentes o de Freight forwarder, puede comunicarse con un gastroenterlogo a cualquier hora llamando al [phone].  DIETA:  Recomendamos una comida pequea al principio, pero luego puede continuar con su dieta normal.  Tome muchos lquidos, Teacher, adult education las bebidas alcohlicas durante 24 horas.    ACTIVIDAD:  Debe  planear tomarse las cosas con calma por el resto del da y no debe CONDUCIR ni usar maquinaria pesada Programmer, applications (debido a los medicamentos de sedacin utilizados durante el examen).     SEGUIMIENTO: Nuestro personal llamar al nmero que aparece en su historial al siguiente da hbil de su procedimiento para ver cmo se siente y para responder cualquier pregunta o inquietud que pueda tener con respecto a la informacin que se le dio despus del procedimiento. Si no podemos contactarle, le dejaremos un mensaje.  Sin embargo, si se siente bien y no tiene Paediatric nurse, no es necesario que nos devuelva la llamada.  Asumiremos que ha regresado a sus actividades diarias normales sin incidentes. Si se le tomaron algunas biopsias, le contactaremos por telfono o por carta en las prximas 3 semanas.  Si no ha sabido [employer] biopsias en el transcurso de 3 semanas, por favor llmenos al [phone].  FIRMAS/CONFIDENCIALIDAD: Usted y/o el acompaante que le cuide han firmado documentos que se ingresarn en su historial mdico Emergency planning/management officer.  Estas firmas atestiguan el hecho de que la informacin anterior USTED TUVO UN PROCEDIMIENTO ENDOSCPICO [facility] EN EL [facility]:   Lea el informe del procedimiento que se le entreg para cualquier pregunta especfica sobre lo que se Primary school teacher.  Si el informe del examen no responde a sus preguntas, por favor llame a su gastroenterlogo para aclararlo.  Si usted solicit que no se le den [employer] de lo que se Estate manager/land agent en su procedimiento al [employer] va a cuidar, entonces el informe del procedimiento se ha incluido en un sobre sellado para que usted lo revise despus cuando le sea ms conveniente.   LO QUE PUEDE ESPERAR: Algunas sensaciones de hinchazn en el abdomen.  Puede tener ms gases de lo normal.  El caminar puede ayudarle a eliminar el aire que se le puso en el tracto gastrointestinal durante el procedimiento y reducir la  hinchazn.  Si le hicieron una endoscopia inferior (como una colonoscopia o una sigmoidoscopia flexible), podra notar manchas de sangre en las heces fecales o en el papel higinico.  Si se someti a una preparacin intestinal para su procedimiento, es posible que no tenga una evacuacin intestinal normal durante [employer].   Tenga en cuenta:  Es posible que note un poco de irritacin y congestin en la nariz o algn drenaje.  Esto es debido al oxgeno [employer] durante su procedimiento.  No hay que preocuparse y esto debe desaparecer ms o Scientist, research (medical).   SNTOMAS PARA REPORTAR INMEDIATAMENTE:  Despus de una endoscopia inferior (colonoscopia o sigmoidoscopia flexible):  Cantidades excesivas de sangre en las heces fecales  Sensibilidad significativa o empeoramiento de los dolores abdominales   Hinchazn aguda del abdomen que antes no tena   Fiebre de 100F o ms   Despus de la endoscopia superior (EGD)  Vmitos de Biochemist, clinical o material como caf molido   Dolor en el pecho o dolor debajo de los omplatos que antes no tena   Dolor o dificultad persistente para tragar  Falta de aire que antes no tena   Fiebre de 100F o ms  Heces fecales negras y pegajosas   Para asuntos urgentes o de Freight forwarder, puede comunicarse con un gastroenterlogo a cualquier hora llamando al [phone].  DIETA:  Recomendamos una comida pequea al principio, pero luego puede continuar con su dieta normal.  Tome muchos lquidos, Teacher, adult education las bebidas alcohlicas durante 24 horas.    ACTIVIDAD:  Debe planear tomarse las cosas con calma por el resto del da y no debe CONDUCIR ni usar maquinaria pesada Programmer, applications (debido a los medicamentos de sedacin utilizados durante el examen).     SEGUIMIENTO: Nuestro personal llamar al nmero que aparece en su historial al siguiente da hbil de su procedimiento para ver cmo se siente y para responder cualquier pregunta o inquietud que pueda tener con respecto a  la informacin que se le dio despus del procedimiento. Si no podemos contactarle, le dejaremos un mensaje.  Sin embargo, si se siente bien y no tiene Paediatric nurse, no es necesario que nos devuelva la llamada.  Asumiremos que ha regresado a sus actividades diarias normales sin incidentes. Si se le tomaron algunas biopsias, le contactaremos por telfono o por carta en las prximas 3 semanas.  Si no ha sabido [employer] biopsias en el transcurso de 3 semanas, por favor  llmenos al [phone].   FIRMAS/CONFIDENCIALIDAD: Usted y/o el acompaante que le cuide han firmado documentos que se ingresarn en su historial mdico electrnico.  Estas firmas atestiguan el hecho de que la informacin anterior     Handouts were given to you on polyps, hemorrhoids and gastritis.  Instructions were given in spanish AVS, colon polyps and hemorrhoids. You may resume your current medications today. Await biopsy results. Please call if any questions or concerns.   Gastritis - Adultos Gastritis, Adult  La gastritis es una hinchazn (inflamacin) del estmago. La gastritis puede desarrollarse rpidamente (aguda). Tambin puede desarrollarse lentamente con el transcurso del tiempo (crnica). Es importante recibir ayuda para Personnel officer. Si no obtiene [place], Nurse, children's y Chief Executive Officer (lceras) en el [employer]. Cules son las causas? Esta afeccin puede ser causada por lo siguiente:  Grmenes que llegan al [employer].  Beber alcohol en exceso.  Los medicamentos que toma.  Demasiado cido en el estmago.  Una enfermedad del intestino o del estmago.  Estrs.  Una reaccin alrgica.  Enfermedad de Crohn.  Algunos tratamientos para el cncer (radiacin). A veces, se desconoce la causa de esta afeccin. Cules son los signos o los sntomas? Los sntomas de esta afeccin incluyen los siguientes:  Research scientist (life sciences).  Una sensacin de Science writer.  Ganas  de vomitar (nuseas).  Vmitos.  Sensacin de estar demasiado lleno luego de comer.  Prdida de peso.  Mal aliento.  Vomitar sangre.  Sangre en las heces. Cmo se diagnostica? Esta afeccin puede diagnosticarse en funcin de lo siguiente:  Los antecedentes mdicos y sntomas.  Un examen fsico.  Estudios. Estos pueden incluir los siguientes: ? Anlisis de [place]. ? Pruebas de heces. ? Un procedimiento para observar el interior del estmago (endoscopa alta). ? Un estudio en el cual se toma una muestra de tejido para analizarlo (biopsia). Cmo se trata? El tratamiento de esta afeccin depende de la causa. Es posible que le administren lo siguiente:  Antibiticos si la afeccin es causada por grmenes.  Bloqueadores H2 y medicamentos similares, si la afeccin fue causada por una gran cantidad de cido. Siga estas indicaciones en su casa: Medicamentos  [employer] de venta libre y los recetados solamente como se lo haya indicado el mdico.  Si le recetaron un antibitico, tmelo como se lo haya indicado el mdico. No deje de tomarlo aunque comience a sentirse mejor. Comida y bebida   Haga comidas pequeas y frecuentes en lugar de comidas abundantes.  Evite los alimentos y las bebidas que intensifican los sntomas.  Beba suficiente lquido para Consulting civil engineer orina de color amarillo plido. Consumo de alcohol  No beba alcohol si: ? El mdico le indica que no lo haga. ? Est embarazada, puede estar embarazada o est tratando de quedar embarazada.  Si bebe alcohol: ? Limite su uso a las siguientes medidas:  De 0 a 1 medida por da para las mujeres.  De 0 a 2 medidas por da para los hombres. ? Est atento a la cantidad de alcohol que hay en las bebidas que toma. En los [place], una medida equivale a una botella de cerveza de 12oz (330ml), un vaso de vino de 5oz (148ml) o un vaso de una bebida alcohlica de alta graduacin de 1oz  (33ml). Indicaciones generales  Converse con el mdico sobre maneras de Scientific laboratory technician. Puede realizar ejercicios de respiracin profunda, meditacin o yoga.  No fume ni consuma productos que contengan nicotina  ni tabaco. Si necesita ayuda para dejar de fumar, consulte al mdico.  Concurra a todas las visitas de seguimiento como se lo haya indicado el mdico. Esto es importante. Comunquese con un mdico si:  Sus sntomas empeoran.  Los sntomas desaparecen y Teacher, adult education. Solicite ayuda inmediatamente si:  Vomita sangre o una sustancia parecida a los granos de [place].  La materia fecal es negra o de color rojo oscuro.  Vomita cada vez que intenta tomar lquidos.  El dolor de [employer].  Tiene fiebre.  No se siente mejor luego de [employer]. Resumen  La gastritis es una hinchazn (inflamacin) del estmago.  Debe obtener ayuda para tratar esta afeccin. Si no obtiene [place], el estmago puede sangrar y pueden formarse llagas (lceras).  Esta afeccin se diagnostica mediante los antecedentes mdicos, un examen fsico o estudios.  Puede recibir tratamiento con medicamentos para los grmenes o medicamentos para bloquear la presencia de demasiada cantidad de cido Nurse, adult. Esta informacin no tiene Marine scientist el consejo del mdico. Asegrese de hacerle al mdico cualquier pregunta que tenga. Document Revised: 10/21/2017 Document Reviewed: 10/21/2017 Elsevier Patient Education  [place].  Gastritis - Adultos Gastritis, Adult  La gastritis es una hinchazn (inflamacin) del estmago. La gastritis puede desarrollarse rpidamente (aguda). Tambin puede desarrollarse lentamente con el transcurso del tiempo (crnica). Es importante recibir ayuda para Personnel officer. Si no obtiene [place], Nurse, children's y Chief Executive Officer (lceras) en el [employer]. Cules son las causas? Esta afeccin puede ser causada por lo  siguiente:  Grmenes que llegan al [employer].  Beber alcohol en exceso.  Los medicamentos que toma.  Demasiado cido en el estmago.  Una enfermedad del intestino o del estmago.  Estrs.  Una reaccin alrgica.  Enfermedad de Crohn.  Algunos tratamientos para el cncer (radiacin). A veces, se desconoce la causa de esta afeccin. Cules son los signos o los sntomas? Los sntomas de esta afeccin incluyen los siguientes:  Research scientist (life sciences).  Una sensacin de Science writer.  Ganas de vomitar (nuseas).  Vmitos.  Sensacin de estar demasiado lleno luego de comer.  Prdida de peso.  Mal aliento.  Vomitar sangre.  Sangre en las heces. Cmo se diagnostica? Esta afeccin puede diagnosticarse en funcin de lo siguiente:  Los antecedentes mdicos y sntomas.  Un examen fsico.  Estudios. Estos pueden incluir los siguientes: ? Anlisis de [place]. ? Pruebas de heces. ? Un procedimiento para observar el interior del estmago (endoscopa alta). ? Un estudio en el cual se toma una muestra de tejido para analizarlo (biopsia). Cmo se trata? El tratamiento de esta afeccin depende de la causa. Es posible que le administren lo siguiente:  Antibiticos si la afeccin es causada por grmenes.  Bloqueadores H2 y medicamentos similares, si la afeccin fue causada por una gran cantidad de cido. Siga estas indicaciones en su casa: Medicamentos  [employer] de venta libre y los recetados solamente como se lo haya indicado el mdico.  Si le recetaron un antibitico, tmelo como se lo haya indicado el mdico. No deje de tomarlo aunque comience a sentirse mejor. Comida y bebida   Haga comidas pequeas y frecuentes en lugar de comidas abundantes.  Evite los alimentos y las bebidas que intensifican los sntomas.  Beba suficiente lquido para Consulting civil engineer orina de color amarillo plido. Consumo de alcohol  No beba alcohol si: ? El mdico le indica  que no lo haga. ? Est embarazada, puede estar embarazada  o est tratando de quedar embarazada.  Si bebe alcohol: ? Limite su uso a las siguientes medidas:  De 0 a 1 medida por da para las mujeres.  De 0 a 2 medidas por da para los hombres. ? Est atento a la cantidad de alcohol que hay en las bebidas que toma. En los [place], una medida equivale a una botella de cerveza de 12oz (345ml), un vaso de vino de 5oz (19ml) o un vaso de una bebida alcohlica de alta graduacin de 1oz (70ml). Indicaciones generales  Converse con el mdico sobre maneras de Scientific laboratory technician. Puede realizar ejercicios de respiracin profunda, meditacin o yoga.  No fume ni consuma productos que contengan nicotina ni tabaco. Si necesita ayuda para dejar de fumar, consulte al mdico.  Concurra a todas las visitas de seguimiento como se lo haya indicado el mdico. Esto es importante. Comunquese con un mdico si:  Sus sntomas empeoran.  Los sntomas desaparecen y Teacher, adult education. Solicite ayuda inmediatamente si:  Vomita sangre o una sustancia parecida a los granos de [place].  La materia fecal es negra o de color rojo oscuro.  Vomita cada vez que intenta tomar lquidos.  El dolor de [place].  Tiene fiebre.  No se siente mejor luego de [employer]. Resumen  La gastritis es una hinchazn (inflamacin) del estmago.  Debe obtener ayuda para tratar esta afeccin. Si no obtiene [place], el estmago puede sangrar y pueden formarse llagas (lceras).  Esta afeccin se diagnostica mediante los antecedentes mdicos, un examen fsico o estudios.  Puede recibir tratamiento con medicamentos para los grmenes o medicamentos para bloquear la presencia de demasiada cantidad de cido Nurse, adult. Esta informacin no tiene Marine scientist el consejo del mdico. Asegrese de hacerle al mdico cualquier pregunta que tenga. Document Revised: 10/21/2017 Document Reviewed:  10/21/2017 Elsevier Patient Education  [place].

## 2019-08-28 NOTE — Op Note (Signed)
[facility] Patient Name: Brittany Huber Procedure Date: 08/28/2019 8:29 AM MRN: [id number] Endoscopist: Mauri Pole , MD Age: 49 Referring MD:  Date of Birth: November 09, 1970 Gender: Female Account #: [id number] Procedure:                Colonoscopy Indications:              Screening for colorectal malignant neoplasm Medicines:                Monitored Anesthesia Care Procedure:                Pre-Anesthesia Assessment:                           - Prior to the procedure, a History and Physical                            was performed, and patient medications and                            allergies were reviewed. The patient's tolerance of                            previous anesthesia was also reviewed. The risks                            and benefits of the procedure and the sedation                            options and risks were discussed with the patient.                            All questions were answered, and informed consent                            was obtained. Prior Anticoagulants: The patient has                            taken no previous anticoagulant or antiplatelet                            agents. ASA Grade Assessment: II - A patient with                            mild systemic disease. After reviewing the risks                            and benefits, the patient was deemed in                            satisfactory condition to undergo the procedure.                           After obtaining informed consent, the colonoscope  was passed under direct vision. Throughout the                            procedure, the patient's blood pressure, pulse, and                            oxygen saturations were monitored continuously. The                            Colonoscope was introduced through the anus and                            advanced to the the cecum, identified by                            appendiceal orifice  and ileocecal valve. The                            colonoscopy was performed without difficulty. The                            patient tolerated the procedure well. The quality                            of the bowel preparation was good. The ileocecal                            valve, appendiceal orifice, and rectum were                            photographed. Scope In: 8:44:24 AM Scope Out: 8:59:08 AM Scope Withdrawal Time: 0 hours 10 minutes 11 seconds  Total Procedure Duration: 0 hours 14 minutes 44 seconds  Findings:                 The perianal and digital rectal examinations were                            normal.                           A 5 mm polyp was found in the cecum. The polyp was                            sessile. The polyp was removed with a cold snare.                            Resection and retrieval were complete.                           Non-bleeding internal hemorrhoids were found during                            retroflexion. The hemorrhoids were small.  The exam was otherwise without abnormality. Complications:            No immediate complications. Estimated Blood Loss:     Estimated blood loss was minimal. Impression:               - One 5 mm polyp in the cecum, removed with a cold                            snare. Resected and retrieved.                           - Non-bleeding internal hemorrhoids.                           - The examination was otherwise normal. Recommendation:           - Patient has a contact number available for                            emergencies. The signs and symptoms of potential                            delayed complications were discussed with the                            patient. Return to normal activities tomorrow.                            Written discharge instructions were provided to the                            patient.                           - Resume previous diet.                            - Continue present medications.                           - Await pathology results.                           - Repeat colonoscopy in 5-10 years for surveillance                            based on pathology results. Mauri Pole, MD 08/28/2019 9:03:47 AM This report has been signed electronically.

## 2019-08-28 NOTE — Progress Notes (Signed)
Brittany Huber, Interpreter went over all the discharge instructions with the pt.  No questions were noted.  I attached AVS, colon polyps,hemorrhoids, and gastritis in spanish.  Unable to find gastritis in spanish.  Brittany Huber was aware of this and he went over very well info and questions. Also he explained we will call back in two days between 07:15-08:15 to check and see if any problems and go through [place] questions.  I wanted to make sure pt understands, since no one speaks english well in the home.  Pt understands a little bit.  No problems noted in the recovery room. maw

## 2019-08-30 ENCOUNTER — Telehealth: Payer: Self-pay | Admitting: *Deleted

## 2019-08-30 NOTE — Telephone Encounter (Signed)
  Follow up Call-  Call back number 08/28/2019  Post procedure Call Back phone  # [phone]  Permission to leave phone message Yes  Some recent data might be hidden     Patient questions:  Do you have a fever, pain , or abdominal swelling? No. Pain Score  0 *  Have you tolerated food without any problems? Yes.    Have you been able to return to your normal activities? Yes.    Do you have any questions about your discharge instructions: Diet   No. Medications  No. Follow up visit  No.  Do you have questions or concerns about your Care? No.  Actions: * If pain score is 4 or above: No action needed, pain <4.  1. Have you developed a fever since your procedure? no  2.   Have you had an respiratory symptoms (SOB or cough) since your procedure? no  3.   Have you tested positive for COVID 19 since your procedure no  4.   Have you had any family members/close contacts diagnosed with the COVID 19 since your procedure?  no   If yes to any of these questions please route to Joylene John, RN and Erenest Rasher, RN

## 2019-08-31 ENCOUNTER — Other Ambulatory Visit: Payer: Self-pay | Admitting: Internal Medicine

## 2019-08-31 MED ORDER — OMEPRAZOLE 40 MG PO CPDR: DELAYED_RELEASE_CAPSULE | ORAL | 1 refills | Status: DC

## 2019-08-31 MED ORDER — ATORVASTATIN CALCIUM 20 MG PO TABS: 20.0000 mg | ORAL_TABLET | Freq: Every day | ORAL | 0 refills | Status: DC

## 2019-08-31 NOTE — Progress Notes (Signed)
I called pt and informed her about her lipid panel and check on how she did Monday with endoscopy and colonoscopy. She was told she has gastritis and needs to continue the prilosec, but the 20 mg is not helping her. So I increased her dose to 40 mg qd x 8 weeek. GI told her they will send me her report and I am to follow up with her about the pathology report

## 2019-09-01 ENCOUNTER — Encounter: Payer: Self-pay | Admitting: Gastroenterology

## 2019-09-01 ENCOUNTER — Other Ambulatory Visit: Payer: Self-pay

## 2019-09-06 ENCOUNTER — Other Ambulatory Visit: Payer: Self-pay

## 2019-09-06 DIAGNOSIS — A048 Other specified bacterial intestinal infections: Secondary | ICD-10-CM

## 2019-09-06 MED ORDER — DOXYCYCLINE HYCLATE 100 MG PO CAPS: 100.0000 mg | ORAL_CAPSULE | Freq: Two times a day (BID) | ORAL | 0 refills | Status: AC

## 2019-09-06 MED ORDER — METRONIDAZOLE 250 MG PO TABS: 250.0000 mg | ORAL_TABLET | Freq: Four times a day (QID) | ORAL | 0 refills | Status: AC

## 2019-09-28 ENCOUNTER — Ambulatory Visit: Payer: Self-pay | Admitting: Internal Medicine

## 2019-10-02 ENCOUNTER — Ambulatory Visit: Payer: Self-pay | Admitting: Nurse Practitioner

## 2019-10-05 ENCOUNTER — Ambulatory Visit: Payer: Self-pay | Admitting: Internal Medicine

## 2019-10-12 ENCOUNTER — Other Ambulatory Visit: Payer: No Typology Code available for payment source

## 2019-10-13 ENCOUNTER — Other Ambulatory Visit: Payer: No Typology Code available for payment source

## 2019-10-13 DIAGNOSIS — A048 Other specified bacterial intestinal infections: Secondary | ICD-10-CM

## 2019-10-18 LAB — H. PYLORI ANTIGEN, STOOL: H pylori Ag, Stl: NEGATIVE

## 2019-10-23 ENCOUNTER — Ambulatory Visit: Payer: Self-pay | Admitting: Nurse Practitioner

## 2019-10-23 ENCOUNTER — Other Ambulatory Visit: Payer: Self-pay

## 2019-10-23 ENCOUNTER — Encounter: Payer: Self-pay | Admitting: Nurse Practitioner

## 2019-10-23 VITALS — BP 118/64 | HR 69 | Temp 98.3°F | Ht 59.8 in | Wt 137.6 lb

## 2019-10-23 DIAGNOSIS — K2900 Acute gastritis without bleeding: Secondary | ICD-10-CM

## 2019-10-23 DIAGNOSIS — E782 Mixed hyperlipidemia: Secondary | ICD-10-CM

## 2019-10-23 MED ORDER — OMEPRAZOLE 40 MG PO CPDR: DELAYED_RELEASE_CAPSULE | ORAL | 0 refills | Status: AC

## 2019-10-23 MED ORDER — ATORVASTATIN CALCIUM 20 MG PO TABS: 20.0000 mg | ORAL_TABLET | Freq: Every day | ORAL | 1 refills | Status: AC

## 2019-10-23 NOTE — Patient Instructions (Signed)
Gastritis - Adultos Gastritis, Adult  La gastritis es una hinchazn (inflamacin) del estmago. La gastritis puede desarrollarse rpidamente (aguda). Tambin puede desarrollarse lentamente con el transcurso del tiempo (crnica). Es importante recibir ayuda para Personnel officer. Si no obtiene [place], Nurse, children's y Chief Executive Officer (lceras) en el [employer]. Cules son las causas? Esta afeccin puede ser causada por lo siguiente:  Grmenes que llegan al [employer].  Beber alcohol en exceso.  Los medicamentos que toma.  Demasiado cido en el estmago.  Una enfermedad del intestino o del estmago.  Estrs.  Una reaccin alrgica.  Enfermedad de Crohn.  Algunos tratamientos para el cncer (radiacin). A veces, se desconoce la causa de esta afeccin. Cules son los signos o los sntomas? Los sntomas de esta afeccin incluyen los siguientes:  Research scientist (life sciences).  Una sensacin de Science writer.  Ganas de vomitar (nuseas).  Vmitos.  Sensacin de estar demasiado lleno luego de comer.  Prdida de peso.  Mal aliento.  Vomitar sangre.  Sangre en las heces. Cmo se diagnostica? Esta afeccin puede diagnosticarse en funcin de lo siguiente:  Los antecedentes mdicos y sntomas.  Un examen fsico.  Estudios. Estos pueden incluir los siguientes: ? Anlisis de [place]. ? Pruebas de heces. ? Un procedimiento para observar el interior del estmago (endoscopa alta). ? Un estudio en el cual se toma una muestra de tejido para analizarlo (biopsia). Cmo se trata? El tratamiento de esta afeccin depende de la causa. Es posible que le administren lo siguiente:  Antibiticos si la afeccin es causada por grmenes.  Bloqueadores H2 y medicamentos similares, si la afeccin fue causada por una gran cantidad de cido. Siga estas indicaciones en su casa: Medicamentos  [employer] de venta libre y los recetados solamente como se lo haya  indicado el mdico.  Si le recetaron un antibitico, tmelo como se lo haya indicado el mdico. No deje de tomarlo aunque comience a sentirse mejor. Comida y bebida   Haga comidas pequeas y frecuentes en lugar de comidas abundantes.  Evite los alimentos y las bebidas que intensifican los sntomas.  Beba suficiente lquido para Consulting civil engineer orina de color amarillo plido. Consumo de alcohol  No beba alcohol si: ? El mdico le indica que no lo haga. ? Est embarazada, puede estar embarazada o est tratando de quedar embarazada.  Si bebe alcohol: ? Limite su uso a las siguientes medidas:  De 0 a 1 medida por da para las mujeres.  De 0 a 2 medidas por da para los hombres. ? Est atento a la cantidad de alcohol que hay en las bebidas que toma. En los [facility], una medida equivale a una botella de cerveza de 12oz (347ml), un vaso de vino de 5oz (19ml) o un vaso de una bebida alcohlica de alta graduacin de 1oz (52ml). Indicaciones generales  Converse con el mdico sobre maneras de Scientific laboratory technician. Puede realizar ejercicios de respiracin profunda, meditacin o yoga.  No fume ni consuma productos que contengan nicotina ni tabaco. Si necesita ayuda para dejar de fumar, consulte al mdico.  Concurra a todas las visitas de seguimiento como se lo haya indicado el mdico. Esto es importante. Comunquese con un mdico si:  Sus sntomas empeoran.  Los sntomas desaparecen y Teacher, adult education. Solicite ayuda inmediatamente si:  Vomita sangre o una sustancia parecida a los granos de [place].  La materia fecal es negra o de color rojo oscuro.  Vomita cada vez que intenta tomar lquidos.  El dolor de [place].  Tiene fiebre.  No se siente mejor luego de [employer]. Resumen  La gastritis es una hinchazn (inflamacin) del estmago.  Debe obtener ayuda para tratar esta afeccin. Si no obtiene [place], el estmago puede sangrar y pueden formarse llagas  (lceras).  Esta afeccin se diagnostica mediante los antecedentes mdicos, un examen fsico o estudios.  Puede recibir tratamiento con medicamentos para los grmenes o medicamentos para bloquear la presencia de demasiada cantidad de cido Nurse, adult. Esta informacin no tiene Marine scientist el consejo del mdico. Asegrese de hacerle al mdico cualquier pregunta que tenga. Document Revised: 10/21/2017 Document Reviewed: 10/21/2017 Elsevier Patient Education  [place].

## 2019-10-23 NOTE — Progress Notes (Signed)
This visit occurred during the SARS-CoV-2 public health emergency.  Safety protocols were in place, including screening questions prior to the visit, additional usage of staff PPE, and extensive cleaning of exam room while observing appropriate contact time as indicated for disinfecting solutions.  Subjective:     Patient ID: Brittany Huber , female    DOB: 08/16/1970 , 49 y.o.   MRN: [id number]   Chief Complaint  Patient presents with  . Hyperlipidemia    HPI  She is here today with an interpreter for recheck cholesterol. She is tolerating atorvastatin well, she needs a refill.  When she took medication for the infection in her stomach she was taking atorvastatin two times a day she had muscle weakness.   She was treated for gastritis about one month ago.      Past Medical History:  Diagnosis Date  . Anxiety    controlled with CBD  . Mixed hyperlipidemia      Family History  Problem Relation Age of Onset  . Cancer Maternal Grandmother        uterine cancer  . Hypertension Mother   . Colon cancer Neg Hx   . Esophageal cancer Neg Hx   . Stomach cancer Neg Hx   . Rectal cancer Neg Hx      Current Outpatient Medications:  .  atorvastatin (LIPITOR) 20 MG tablet, Take 1 tablet (20 mg total) by mouth daily., Disp: 90 tablet, Rfl: 0 .  ferrous sulfate 325 (65 FE) MG tablet, Take 325 mg by mouth daily with breakfast., Disp: , Rfl:  .  KRILL OIL PO, Take 500 mg by mouth daily., Disp: , Rfl:  .  Loratadine (CLARITIN) 10 MG CAPS, Take 10 mg by mouth daily as needed. (Patient not taking: Reported on 10/23/2019), Disp: , Rfl:  .  omeprazole (PRILOSEC) 40 MG capsule, Take one 30 minutes before meals for gastritis x 8 weeks (Patient not taking: Reported on 10/23/2019), Disp: 30 capsule, Rfl: 1   No Known Allergies   Review of Systems  Constitutional: Negative.  Negative for fatigue.  Respiratory: Negative.   Cardiovascular: Negative.  Negative for chest pain, palpitations and  leg swelling.  Musculoskeletal: Positive for myalgias (no longer having after the 10 days of taking two times a day. ).  Neurological: Negative for dizziness and headaches.  Psychiatric/Behavioral: Negative.      Today's Vitals   10/23/19 1040  BP: 118/64  Pulse: 69  Temp: 98.3 F (36.8 C)  TempSrc: Oral  Weight: 137 lb 9.6 oz (62.4 kg)  Height: 4' 11.8" (1.519 m)  PainSc: 0-No pain   Body mass index is 27.05 kg/m.   Objective:  Physical Exam Constitutional:      General: She is not in acute distress.    Appearance: Normal appearance. She is obese.  Cardiovascular:     Rate and Rhythm: Normal rate and regular rhythm.     Pulses: Normal pulses.     Heart sounds: Normal heart sounds. No murmur heard.   Pulmonary:     Effort: Pulmonary effort is normal. No respiratory distress.     Breath sounds: Normal breath sounds.  Abdominal:     General: Abdomen is flat. Bowel sounds are normal. There is no distension.     Palpations: Abdomen is soft. There is no mass.     Tenderness: There is no abdominal tenderness.  Neurological:     General: No focal deficit present.     Mental Status: She is alert  and oriented to person, place, and time.  Psychiatric:        Mood and Affect: Mood normal.        Behavior: Behavior normal.        Thought Content: Thought content normal.        Judgment: Judgment normal.         Assessment And Plan:     1. Mixed hyperlipidemia  Chronic, good control at her last blood draw  Advised to take her atorvastatin M-F, to see if this helps with her weakness.   Will check lipid panel at her next visit.    2. Other acute gastritis without hemorrhage  Had endoscopy done shown had gastritis and treated with antibiotics  She also had an increase in omeprazole 40 mg daily and to take for 8 weeks, she is to take for another 2-3 weeks and then we may need to change medication if she is not better.  - omeprazole (PRILOSEC) 40 MG capsule; Take one 30  minutes before meals for gastritis x 8 weeks  Dispense: 90 capsule; Refill: 0    Minette Brine, FNP    THE PATIENT IS ENCOURAGED TO PRACTICE SOCIAL DISTANCING DUE TO THE COVID-19 PANDEMIC.

## 2019-11-20 ENCOUNTER — Ambulatory Visit: Payer: Self-pay | Admitting: Nurse Practitioner

## 2019-11-21 ENCOUNTER — Encounter: Payer: Self-pay | Admitting: Nurse Practitioner

## 2020-02-20 ENCOUNTER — Encounter: Payer: Self-pay | Admitting: *Deleted

## 2020-02-20 NOTE — Congregational Nurse Program (Signed)
  Dept: [facility] Nurse Program Note  Date of Encounter: 02/20/2020  Past Medical History: Past Medical History:  Diagnosis Date  . Anxiety    controlled with CBD  . Mixed hyperlipidemia     Encounter Details:  CNP Questionnaire - 02/20/20 1737      Questionnaire   Do you give verbal consent to treat you today? Yes    Visit Setting Church or Organization    Location Patient Served At [facility]    Patient Status Immigrant    Medical Provider No    Insurance Uninsured (Includes Orange Card/Care [place])    Printmaker;Advocate    Housing/Utilities Worried about losing housing    Food Have food insecurities    Medication Have medication insecurities          Client came into [place] with [employer] application completed and asked for help.  She speaks [place].  Rev Beverlee Nims was [place] interpreter.  Client thinks she has all pieces or orange card application and documentation ready.  Copies of her bank statement were made.  Will fax all application forms and support to [facility] tomorrow at [phone].  Client encouraged to follow up as needed for additional assistance.  Karene Fry, RN, MSN, [place] Office [phone] Cell

## 2020-05-21 ENCOUNTER — Telehealth: Payer: Self-pay

## 2020-05-21 NOTE — Telephone Encounter (Signed)
Patient called wanting to know if we accept the orange card I advised her that we do not. Tyler Deis

## 2020-09-25 ENCOUNTER — Other Ambulatory Visit: Payer: Self-pay | Admitting: Family Medicine

## 2020-09-25 DIAGNOSIS — Z1231 Encounter for screening mammogram for malignant neoplasm of breast: Secondary | ICD-10-CM

## 2020-09-27 ENCOUNTER — Other Ambulatory Visit: Payer: Self-pay | Admitting: Obstetrics and Gynecology

## 2020-09-27 DIAGNOSIS — Z1231 Encounter for screening mammogram for malignant neoplasm of breast: Secondary | ICD-10-CM

## 2020-10-17 ENCOUNTER — Other Ambulatory Visit: Payer: Self-pay

## 2020-10-17 ENCOUNTER — Encounter (INDEPENDENT_AMBULATORY_CARE_PROVIDER_SITE_OTHER): Payer: Self-pay

## 2020-10-17 ENCOUNTER — Ambulatory Visit
Admission: RE | Admit: 2020-10-17 | Discharge: 2020-10-17 | Disposition: A | Discharge status: Home / Self Care | Payer: No Typology Code available for payment source | Source: Ambulatory Visit | Attending: Obstetrics and Gynecology | Admitting: Obstetrics and Gynecology

## 2020-10-17 ENCOUNTER — Ambulatory Visit: Payer: Self-pay | Admitting: *Deleted

## 2020-10-17 VITALS — BP 110/70 | Wt 137.4 lb

## 2020-10-17 DIAGNOSIS — Z1239 Encounter for other screening for malignant neoplasm of breast: Secondary | ICD-10-CM

## 2020-10-17 DIAGNOSIS — Z1231 Encounter for screening mammogram for malignant neoplasm of breast: Secondary | ICD-10-CM

## 2020-10-17 NOTE — Patient Instructions (Signed)
Explained breast self awareness with Brittany Huber. Patient did not need a Pap smear today due to last Pap smear was 06/20/2018. Let her know BCCCP will cover Pap smears every 3 years unless has a history of abnormal Pap smears. Referred patient to the [facility] for a screening mammogram on the mobile unit. Appointment scheduled Thursday, October 17, 2020 at 1140. Patient escorted to the mobile unit following BCCCP appointment for her screening mammogram. Let patient know the [facility] will follow up with her within the next couple weeks with results of her mammogram by letter or phone. Brittany Huber verbalized understanding.  Genella Bas, Arvil Chaco, RN 10:52 AM

## 2020-10-17 NOTE — Progress Notes (Signed)
[name] is a 50 y.o. female who presents to [facility] clinic today with no complaints.    Pap Smear: Pap smear not completed today. Last Pap smear was 06/20/2018 at the free cervical cancer screening clinic and was normal. Per patient has a history of an abnormal Pap smear in 2010 that required a Colposcopy and LEEP for follow-up. Per patient has had at least three normal Pap smears since LEEP. Last four Pap smear results are in EPIC.   Physical exam: Breasts Breasts symmetrical. No skin abnormalities bilateral breasts. No nipple retraction bilateral breasts. No nipple discharge bilateral breasts. No lymphadenopathy. No lumps palpated bilateral breasts. Complaints of left lower breast tenderness on exam that patient stated has been tender to touch since prior to last mammogram in 2021.  MS DIGITAL SCREENING BILATERAL  Result Date: 04/15/2017 CLINICAL DATA:  Screening. EXAM: DIGITAL SCREENING BILATERAL MAMMOGRAM WITH CAD COMPARISON:  Previous exam(s). ACR Breast Density Category c: The breast tissue is heterogeneously dense, which may obscure small masses. FINDINGS: There are no findings suspicious for malignancy. Images were processed with CAD. IMPRESSION: No mammographic evidence of malignancy. A result letter of this screening mammogram will be mailed directly to the patient. RECOMMENDATION: Screening mammogram in one year. (Code:SM-B-01Y) BI-RADS CATEGORY  1: Negative. Electronically Signed   By: Claudie Revering M.D.   On: 04/15/2017 09:41   MS DIGITAL SCREENING BILATERAL  Result Date: 02/13/2016 CLINICAL DATA:  Screening. EXAM: DIGITAL SCREENING BILATERAL MAMMOGRAM WITH CAD COMPARISON:  Previous exam(s). ACR Breast Density Category c: The breast tissue is heterogeneously dense, which may obscure small masses. FINDINGS: In the left breast, a possible mass warrants further evaluation. In the right breast, no findings suspicious for malignancy. Images were processed with CAD. IMPRESSION:  Further evaluation is suggested for possible mass in the left breast. RECOMMENDATION: Diagnostic mammogram and possibly ultrasound of the left breast. (Code:FI-L-36M) The patient will be contacted regarding the findings, and additional imaging will be scheduled. BI-RADS CATEGORY  0: Incomplete. Need additional imaging evaluation and/or prior mammograms for comparison. Electronically Signed   By: Lillia Mountain M.D.   On: 02/14/2016 15:21   MM DIAG BREAST TOMO UNI LEFT  Result Date: 02/25/2016 CLINICAL DATA:  The patient returns after screening study for evaluation of a possible left breast mass. EXAM: 2D DIGITAL DIAGNOSTIC LEFT MAMMOGRAM WITH CAD AND ADJUNCT TOMO ULTRASOUND LEFT BREAST COMPARISON:  Prior studies including 02/13/2016 ACR Breast Density Category c: The breast tissue is heterogeneously dense, which may obscure small masses. FINDINGS: Additional views are performed demonstrating possible mass in the lateral portion of the left breast further evaluated with ultrasound. Mammographic images were processed with CAD. On physical exam, I palpate no abnormality in the lateral aspect of the left breast. Targeted ultrasound is performed, showing a simple cyst in the 230 o'clock location of the left breast 4 cm from the nipple which measures 1.7 x 0.6 x 1.4 cm. No solid mass or acoustic shadowing identified. IMPRESSION: Simple cyst in the 230 o'clock location of the left breast. No mammographic or ultrasound evidence for malignancy. RECOMMENDATION: Screening mammogram in one year.(Code:SM-B-01Y) I have discussed the findings and recommendations with the patient. Results were also provided in writing at the conclusion of the visit. If applicable, a reminder letter will be sent to the patient regarding the next appointment. BI-RADS CATEGORY  2: Benign. Electronically Signed   By: Nolon Nations M.D.   On: 02/25/2016 14:14   MS DIGITAL SCREENING TOMO BILATERAL  Result Date:  08/01/2019 CLINICAL DATA:   Screening. EXAM: DIGITAL SCREENING BILATERAL MAMMOGRAM WITH TOMO AND CAD COMPARISON:  Previous exam(s). ACR Breast Density Category c: The breast tissue is heterogeneously dense, which may obscure small masses. FINDINGS: There are no findings suspicious for malignancy. Images were processed with CAD. IMPRESSION: No mammographic evidence of malignancy. A result letter of this screening mammogram will be mailed directly to the patient. RECOMMENDATION: Screening mammogram in one year. (Code:SM-B-01Y) BI-RADS CATEGORY  1: Negative. Electronically Signed   By: Everlean Alstrom M.D.   On: 08/01/2019 14:13   MS DIGITAL SCREENING TOMO BILATERAL  Result Date: 07/13/2018 CLINICAL DATA:  Screening. EXAM: DIGITAL SCREENING BILATERAL MAMMOGRAM WITH TOMO AND CAD COMPARISON:  Previous exam(s). ACR Breast Density Category c: The breast tissue is heterogeneously dense, which may obscure small masses. FINDINGS: There are no findings suspicious for malignancy. Images were processed with CAD. IMPRESSION: No mammographic evidence of malignancy. A result letter of this screening mammogram will be mailed directly to the patient. RECOMMENDATION: Screening mammogram in one year. (Code:SM-B-01Y) BI-RADS CATEGORY  1: Negative. Electronically Signed   By: Nolon Nations M.D.   On: 07/13/2018 12:45        Pelvic/Bimanual Pap is not indicated today per BCCCP guidelines.   Smoking History: Patient has never smoked.   Patient Navigation: Patient education provided. Access to services provided for patient through [place] program. Spanish interpreter Rudene Anda from [facility] provided.   Colorectal Cancer Screening: Per patient has had colonoscopy completed on 08/28/2019 at [place].  No complaints today.    Breast and Cervical Cancer Risk Assessment: Patient has family history of her maternal grandmother having breast cancer. Patient has no known genetic mutations or history of radiation treatment to the chest before age 61. Per  patient has history of cervical dysplasia. Patient has no history of being immunocompromised or DES exposure in-utero.  Risk Assessment     Risk Scores       10/17/2020   Last edited by: Demetrius Revel, LPN   5-year risk: 0.5 %   Lifetime risk: 4.1 %            A: BCCCP exam without pap smear No complaints.  P: Referred patient to the [facility] for a screening mammogram on the mobile unit. Appointment scheduled Thursday, October 17, 2020 at 1140.  Loletta Parish, RN 10/17/2020 10:52 AM

## 2020-10-23 ENCOUNTER — Ambulatory Visit: Payer: Self-pay | Admitting: Internal Medicine

## 2021-07-22 ENCOUNTER — Encounter: Payer: Self-pay | Admitting: *Deleted

## 2021-08-04 NOTE — Congregational Nurse Program (Signed)
?  Dept: [phone] ? ? ?Congregational Nurse Program Note ? ?Date of Encounter: 07/22/2021 ? ?Past Medical History: ?Past Medical History:  ?Diagnosis Date  ? Anxiety   ? controlled with CBD  ? Mixed hyperlipidemia   ? ? ?Encounter Details: ? CNP Questionnaire - 07/22/21 1644   ? ?  ? Questionnaire  ? Do you give verbal consent to treat you today? Yes   ? Location Patient Brittany Huber   ? Visit Setting Church or Organization   ? Patient Status Immigrant   ? Insurance Uninsured (Orange Card/Care Connects/Self-Pay)   ? Insurance Referral [employer]   ? Medication Have Medication Insecurities   ? Medical Provider No   ? Screening Referrals N/A   ? Medical Referral N/A   ? Medical Appointment Made N/A   ? Food Have Food Insecurities   ? Transportation N/A   ? Housing/Utilities N/A   ? Intervention Counsel;Advocate   ? ED Visit Averted Yes   ? Life-Saving Intervention Made N/A   ? ?  ?  ? ?  ? ? ? ? ?Dept: [phone] ? ? ?Congregational Nurse Program Note ? ?Date of Encounter: 07/22/2021 ? ?Past Medical History: ?Past Medical History:  ?Diagnosis Date  ? Anxiety   ? controlled with CBD  ? Mixed hyperlipidemia   ? ? ?Encounter Details: ? ?Client came into [facility] nurse clinic requesting a flu shot.  A flu shot was given to client during visit.  Client also asking about other vaccines and this CN referred her to the public health department for follow up.  Client needs documentation of vaccines and the health department can provide this documentation.  Client tells me that she has her orange card and has been renewing it every 6 months.  Will follow up as desired with this CN. ? ?Karene Fry, RN, MSN, CNP ?[phone] Office ?[phone] Cell ? ? ? ?

## 2021-09-08 ENCOUNTER — Other Ambulatory Visit: Payer: Self-pay

## 2021-09-08 ENCOUNTER — Telehealth: Payer: Self-pay

## 2021-09-08 DIAGNOSIS — Z1231 Encounter for screening mammogram for malignant neoplasm of breast: Secondary | ICD-10-CM

## 2021-09-08 NOTE — Telephone Encounter (Signed)
Via Lavon Paganini, Spanish Interpreter [facility]), Patient was contacted regarding 09/16/2021 Cervical Screening appointment as  patient will be due for mammogram 10/18/2021 or after. Patient was informed that she can keep appointment as scheduled and come back later for BCCCP/Screening mammogram, or can be scheduled for a later date to come back for BCCCP (Pap/mammogram) same day. Patient stated she preferred to wait and do pap at [facility] appointment. Patient informed we will call back with appointment.  ?

## 2021-09-16 ENCOUNTER — Ambulatory Visit: Payer: No Typology Code available for payment source

## 2021-11-20 ENCOUNTER — Ambulatory Visit
Admission: RE | Admit: 2021-11-20 | Discharge: 2021-11-20 | Disposition: A | Discharge status: Home / Self Care | Payer: No Typology Code available for payment source | Source: Ambulatory Visit | Attending: Obstetrics and Gynecology | Admitting: Obstetrics and Gynecology

## 2021-11-20 ENCOUNTER — Ambulatory Visit: Payer: Self-pay | Admitting: *Deleted

## 2021-11-20 VITALS — BP 102/64 | Wt 137.9 lb

## 2021-11-20 DIAGNOSIS — Z1231 Encounter for screening mammogram for malignant neoplasm of breast: Secondary | ICD-10-CM

## 2021-11-20 DIAGNOSIS — Z01419 Encounter for gynecological examination (general) (routine) without abnormal findings: Secondary | ICD-10-CM

## 2021-11-20 NOTE — Progress Notes (Signed)
[name] is a 51 y.o. [phone] female who presents to [facility] clinic today with complaint of diffuse bilateral breast pain that lasted a month and resolved 2 weeks ago. Patient has a history of left breast pain since 2013.   Pap Smear: Pap smear completed today. Last Pap smear was 06/20/2018 at the [facility] and was normal. Per patient has a history of an abnormal Pap smear in 2010 that required a Colposcopy and LEEP for follow-up. Per patient has had at least three normal Pap smears since LEEP. Last four Pap smear results are in EPIC.  Physical exam: Breasts Breasts symmetrical. No skin abnormalities bilateral breasts. No nipple retraction bilateral breasts. No nipple discharge bilateral breasts. No lymphadenopathy. No lumps palpated bilateral breasts. Complaints of bilateral outer breast pain on exam.  MS DIGITAL SCREENING TOMO BILATERAL  Result Date: 10/19/2020 CLINICAL DATA:  Screening. EXAM: DIGITAL SCREENING BILATERAL MAMMOGRAM WITH TOMOSYNTHESIS AND CAD TECHNIQUE: Bilateral screening digital craniocaudal and mediolateral oblique mammograms were obtained. Bilateral screening digital breast tomosynthesis was performed. The images were evaluated with computer-aided detection. COMPARISON:  Previous exam(s). ACR Breast Density Category b: There are scattered areas of fibroglandular density. FINDINGS: There are no findings suspicious for malignancy. IMPRESSION: No mammographic evidence of malignancy. A result letter of this screening mammogram will be mailed directly to the patient. RECOMMENDATION: Screening mammogram in one year. (Code:SM-B-01Y) BI-RADS CATEGORY  1: Negative. Electronically Signed   By: Margarette Canada M.D.   On: 10/19/2020 13:13   MS DIGITAL SCREENING TOMO BILATERAL  Result Date: 08/01/2019 CLINICAL DATA:  Screening. EXAM: DIGITAL SCREENING BILATERAL MAMMOGRAM WITH TOMO AND CAD COMPARISON:  Previous exam(s). ACR Breast Density Category c: The breast  tissue is heterogeneously dense, which may obscure small masses. FINDINGS: There are no findings suspicious for malignancy. Images were processed with CAD. IMPRESSION: No mammographic evidence of malignancy. A result letter of this screening mammogram will be mailed directly to the patient. RECOMMENDATION: Screening mammogram in one year. (Code:SM-B-01Y) BI-RADS CATEGORY  1: Negative. Electronically Signed   By: Everlean Alstrom M.D.   On: 08/01/2019 14:13   MS DIGITAL SCREENING TOMO BILATERAL  Result Date: 07/13/2018 CLINICAL DATA:  Screening. EXAM: DIGITAL SCREENING BILATERAL MAMMOGRAM WITH TOMO AND CAD COMPARISON:  Previous exam(s). ACR Breast Density Category c: The breast tissue is heterogeneously dense, which may obscure small masses. FINDINGS: There are no findings suspicious for malignancy. Images were processed with CAD. IMPRESSION: No mammographic evidence of malignancy. A result letter of this screening mammogram will be mailed directly to the patient. RECOMMENDATION: Screening mammogram in one year. (Code:SM-B-01Y) BI-RADS CATEGORY  1: Negative. Electronically Signed   By: Nolon Nations M.D.   On: 07/13/2018 12:45   MS DIGITAL SCREENING BILATERAL  Result Date: 04/15/2017 CLINICAL DATA:  Screening. EXAM: DIGITAL SCREENING BILATERAL MAMMOGRAM WITH CAD COMPARISON:  Previous exam(s). ACR Breast Density Category c: The breast tissue is heterogeneously dense, which may obscure small masses. FINDINGS: There are no findings suspicious for malignancy. Images were processed with CAD. IMPRESSION: No mammographic evidence of malignancy. A result letter of this screening mammogram will be mailed directly to the patient. RECOMMENDATION: Screening mammogram in one year. (Code:SM-B-01Y) BI-RADS CATEGORY  1: Negative. Electronically Signed   By: Claudie Revering M.D.   On: 04/15/2017 09:41        Pelvic/Bimanual Ext Genitalia No lesions, no swelling and no discharge observed on external genitalia.         Vagina Vagina pink and normal  texture. No lesions or discharge observed in vagina.        Cervix Cervix is present. Cervix pink and of normal texture. No discharge observed.    Uterus Uterus is present and palpable. Uterus in normal position and normal size.        Adnexae Bilateral ovaries present and palpable. No tenderness on palpation.         Rectovaginal No rectal exam completed today since patient had no rectal complaints. No skin abnormalities observed on exam.     Smoking History: Patient has never smoked.   Patient Navigation: Patient education provided. Access to services provided for patient through [place] program. Spanish interpreter Rudene Anda from [facility] provided.   Colorectal Cancer Screening: Per patient has had colonoscopy completed on 08/28/2019 at [place]. No complaints today.    Breast and Cervical Cancer Risk Assessment: Patient has family history of her maternal grandmother having breast cancer. Patient has no known genetic mutations or history of radiation treatment to the chest before age 89. Per patient has history of cervical dysplasia. Patient has no history of being immunocompromised or DES exposure in-utero.  Risk Assessment     Risk Scores       11/20/2021 10/17/2020   Last edited by: Demetrius Revel, LPN [place], Sherie Mamie Nick, LPN   5-year risk: 0.5 % 0.5 %   Lifetime risk: 4 % 4.1 %            A: [facility] exam with pap smear Complaint of bilateral breast pain.  P: Referred patient to the [facility] for a screening mammogram on mobile unit. Appointment scheduled Thursday, November 20, 2021 at 1440.  Loletta Parish, RN 11/20/2021 2:04 PM

## 2021-11-20 NOTE — Patient Instructions (Signed)
Explained breast self awareness with Avelino Leeds Lucas-Lopez. Pap smear completed today. Let her know [facility]CP will cover Pap smears and HPV typing every 5 years unless has a history of abnormal Pap smears. Referred patient to the [facility] for a screening mammogram on mobile unit. Appointment scheduled Thursday, November 20, 2021 at 1440. Patient aware of appointment and will be there. Let patient know will follow up with her within the next couple weeks with results of Pap smear by phone. Informed patient that [facility] will follow up with her within the next couple of weeks with results of her mammogram by letter or phone. Avelino Leeds Lucas-Lopez verbalized understanding.  Ludmila Ebarb, Arvil Chaco, RN 2:06 PM

## 2021-11-26 LAB — CYTOLOGY - PAP
Comment: NEGATIVE
Diagnosis: UNDETERMINED — AB
High risk HPV: NEGATIVE

## 2021-12-01 ENCOUNTER — Telehealth: Payer: Self-pay

## 2021-12-01 NOTE — Telephone Encounter (Signed)
Via Brittany Huber, Spanish Interpreter [facility]), Patient informed per Dr. Harolyn Rutherford, pap results ASC-US with negative HPV, repeat pap in 3 years.

## 2022-08-05 IMAGING — MG MM DIGITAL SCREENING BILAT W/ TOMO AND CAD
8 series · 9 of 24 positions shown · non-contrast
Comparison: Previous exam(s).

CLINICAL DATA: Screening.

EXAM:
DIGITAL SCREENING BILATERAL MAMMOGRAM WITH TOMOSYNTHESIS AND CAD
TECHNIQUE: Bilateral screening digital craniocaudal and mediolateral oblique
mammograms were obtained. Bilateral screening digital breast
tomosynthesis was performed. The images were evaluated with
computer-aided detection.

[L MLO synth-2D]
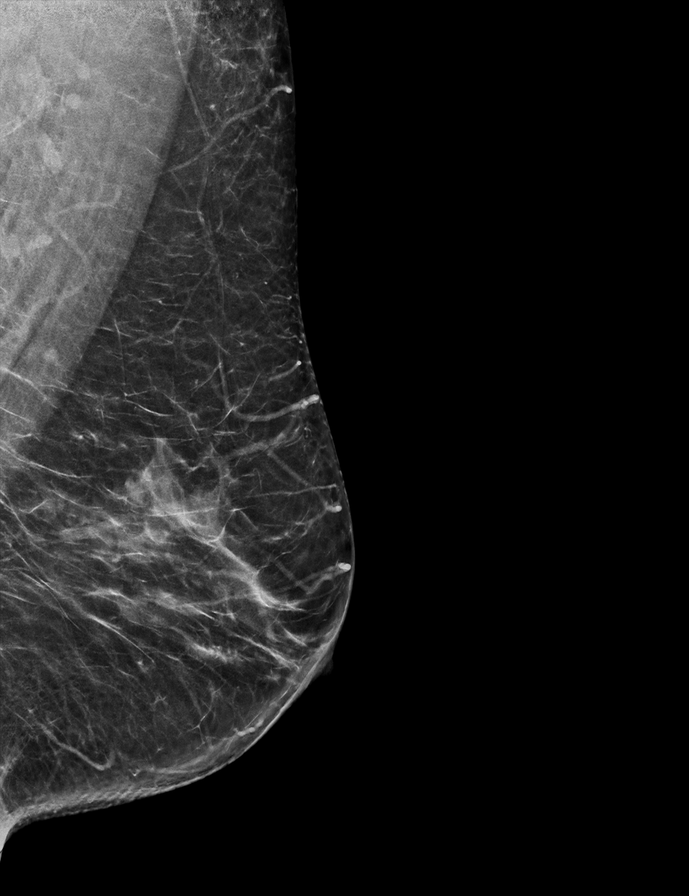

[R CC synth-2D]
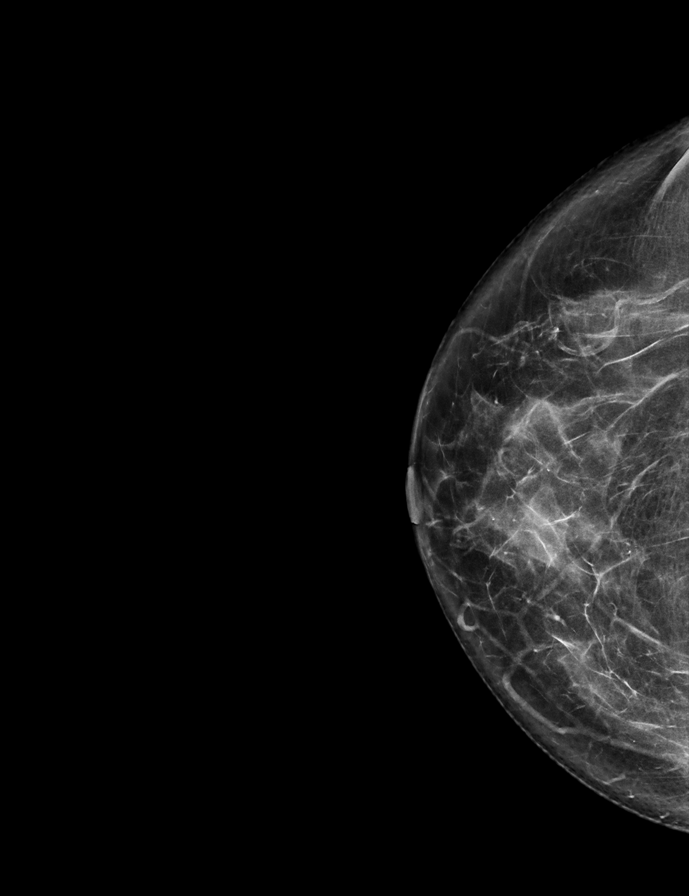

[L CC synth-2D]
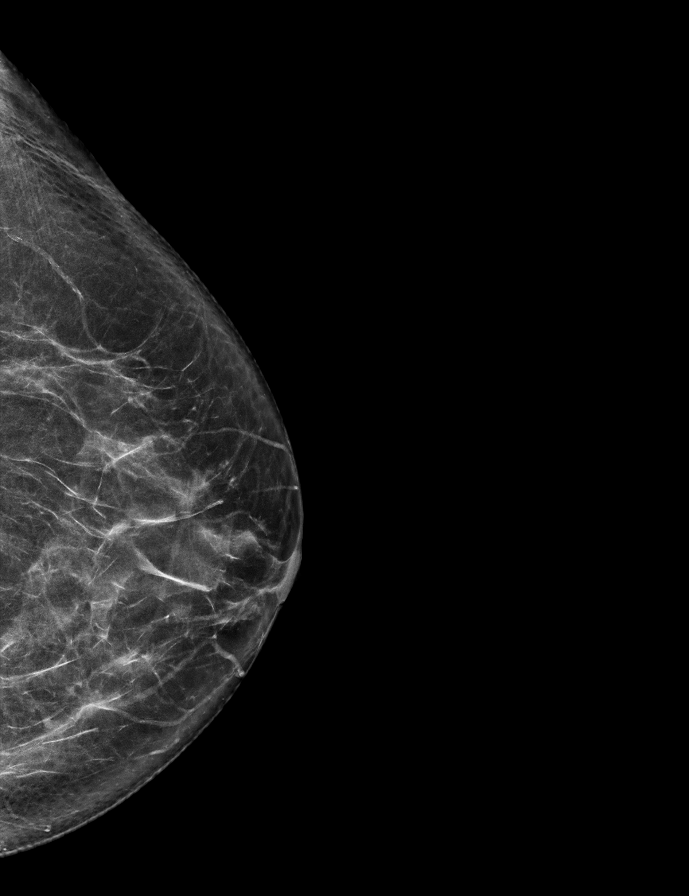

[R MLO synth-2D]
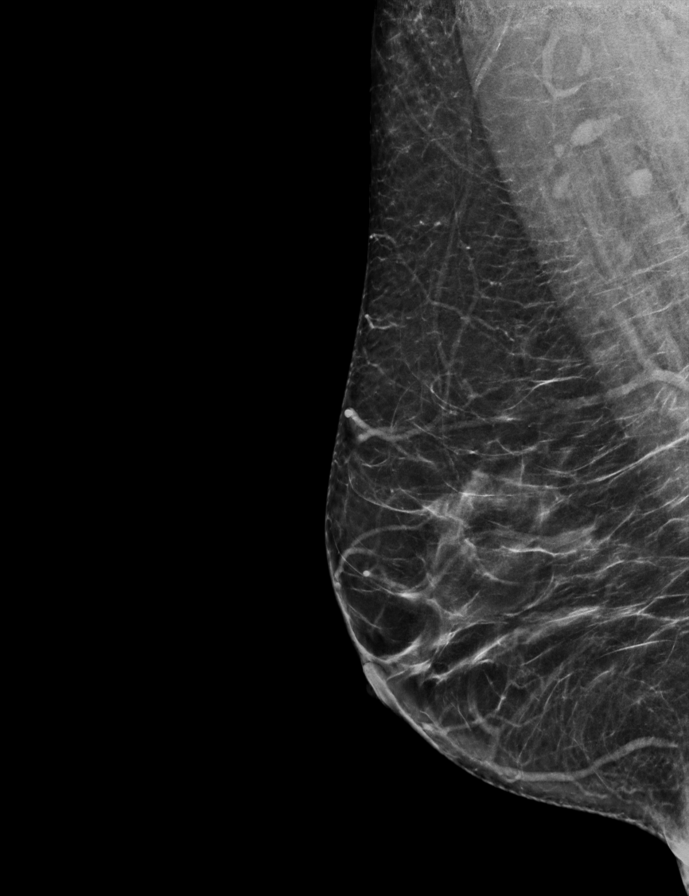

[R CC tomo · 2 of 67 frames shown]
[frame 22/67]
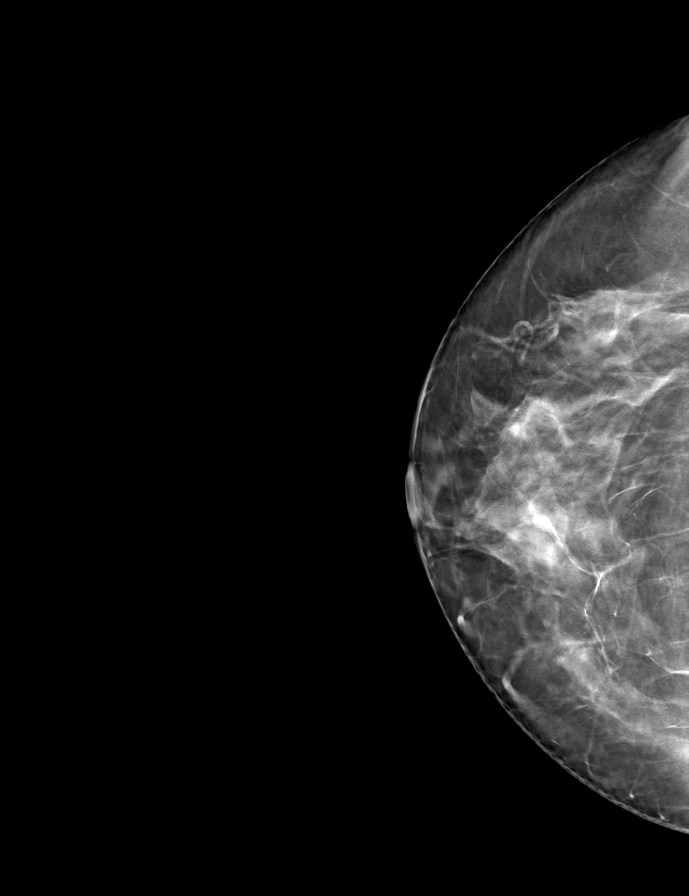
[frame 34/67]
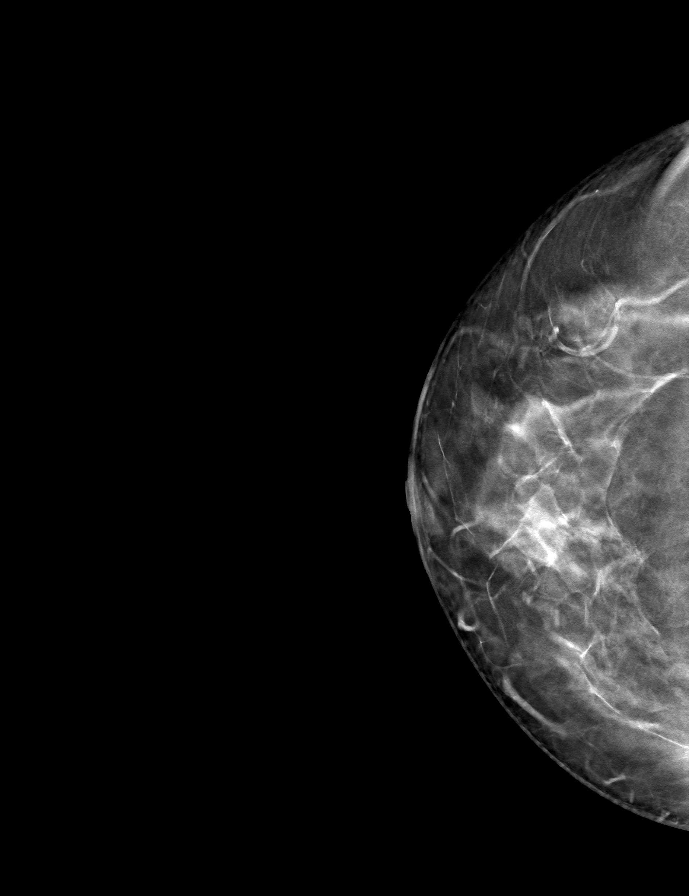

[L MLO tomo · tomo slice 32/63.0]
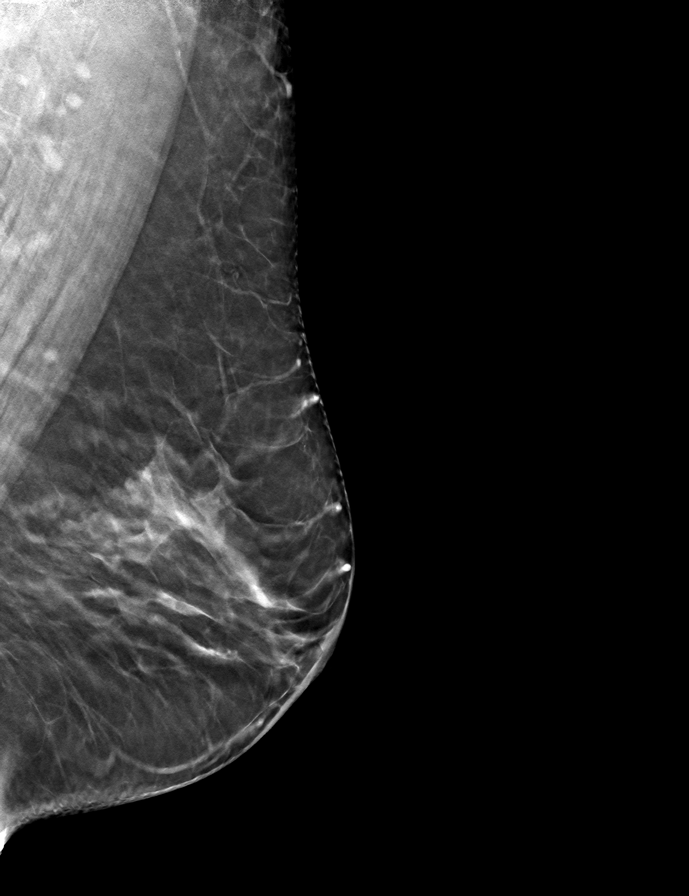

[R MLO tomo · tomo slice 33/64.0]
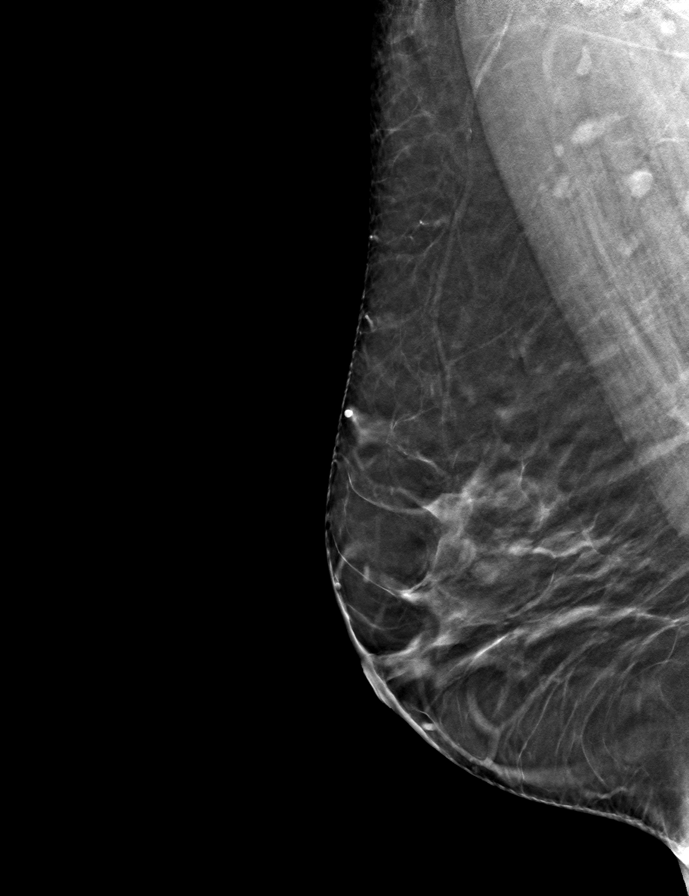

[L CC tomo · tomo slice 33/65.0]
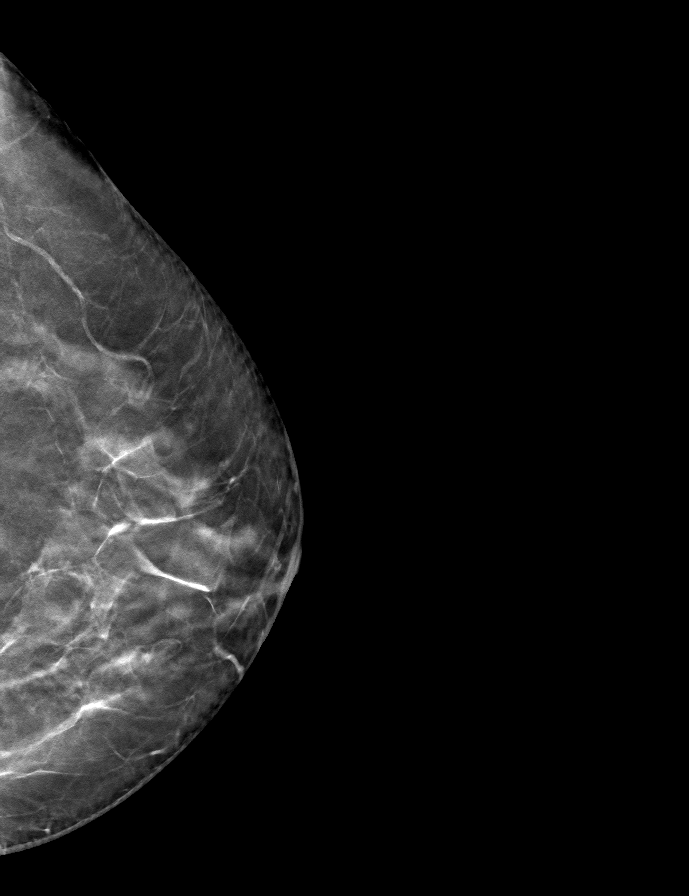

[9 of 24 positions shown; findings below may reference images not displayed]

ACR Breast Density Category b: There are scattered areas of
fibroglandular density.
FINDINGS: There are no findings suspicious for malignancy.
IMPRESSION: No mammographic evidence of malignancy. A result letter of this
screening mammogram will be mailed directly to the patient.

RECOMMENDATION:
Screening mammogram in one year. (Code:51-O-LD2)

BI-RADS CATEGORY  1: Negative.

## 2023-05-20 ENCOUNTER — Encounter (HOSPITAL_COMMUNITY): Payer: Self-pay | Admitting: Emergency Medicine

## 2023-05-20 ENCOUNTER — Emergency Department (HOSPITAL_COMMUNITY): Payer: No Typology Code available for payment source

## 2023-05-20 ENCOUNTER — Emergency Department (HOSPITAL_COMMUNITY)
Admission: EM | Admit: 2023-05-20 | Discharge: 2023-05-20 | Disposition: A | Discharge status: Home / Self Care | Payer: No Typology Code available for payment source | Attending: Emergency Medicine | Admitting: Emergency Medicine

## 2023-05-20 ENCOUNTER — Other Ambulatory Visit: Payer: Self-pay

## 2023-05-20 DIAGNOSIS — R0789 Other chest pain: Secondary | ICD-10-CM | POA: Insufficient documentation

## 2023-05-20 LAB — I-STAT CHEM 8, ED
BUN: 15 mg/dL (ref 6–20)
Calcium, Ion: 1.17 mmol/L (ref 1.15–1.40)
Chloride: 102 mmol/L (ref 98–111)
Creatinine, Ser: 0.6 mg/dL (ref 0.44–1.00)
Glucose, Bld: 94 mg/dL (ref 70–99)
HCT: 44 % (ref 36.0–46.0)
Hemoglobin: 15 g/dL (ref 12.0–15.0)
Potassium: 4 mmol/L (ref 3.5–5.1)
Sodium: 138 mmol/L (ref 135–145)
TCO2: 26 mmol/L (ref 22–32)

## 2023-05-20 LAB — CBC WITH DIFFERENTIAL/PLATELET
Abs Immature Granulocytes: 0.03 10*3/uL (ref 0.00–0.07)
Basophils Absolute: 0 10*3/uL (ref 0.0–0.1)
Basophils Relative: 1 %
Eosinophils Absolute: 0.1 10*3/uL (ref 0.0–0.5)
Eosinophils Relative: 2 %
HCT: 43.6 % (ref 36.0–46.0)
Hemoglobin: 14.5 g/dL (ref 12.0–15.0)
Immature Granulocytes: 0 %
Lymphocytes Relative: 28 %
Lymphs Abs: 1.9 10*3/uL (ref 0.7–4.0)
MCH: 30.3 pg (ref 26.0–34.0)
MCHC: 33.3 g/dL (ref 30.0–36.0)
MCV: 91.2 fL (ref 80.0–100.0)
Monocytes Absolute: 0.4 10*3/uL (ref 0.1–1.0)
Monocytes Relative: 5 %
Neutro Abs: 4.5 10*3/uL (ref 1.7–7.7)
Neutrophils Relative %: 64 %
Platelets: 280 10*3/uL (ref 150–400)
RBC: 4.78 MIL/uL (ref 3.87–5.11)
RDW: 12.6 % (ref 11.5–15.5)
WBC: 7 10*3/uL (ref 4.0–10.5)
nRBC: 0 % (ref 0.0–0.2)

## 2023-05-20 LAB — URINALYSIS, ROUTINE W REFLEX MICROSCOPIC
Bacteria, UA: NONE SEEN
Bilirubin Urine: NEGATIVE
Glucose, UA: NEGATIVE mg/dL
Ketones, ur: NEGATIVE mg/dL
Leukocytes,Ua: NEGATIVE
Nitrite: NEGATIVE
Protein, ur: NEGATIVE mg/dL
Specific Gravity, Urine: 1.029 (ref 1.005–1.030)
pH: 6 (ref 5.0–8.0)

## 2023-05-20 LAB — COMPREHENSIVE METABOLIC PANEL
ALT: 18 U/L (ref 0–44)
AST: 21 U/L (ref 15–41)
Albumin: 4 g/dL (ref 3.5–5.0)
Alkaline Phosphatase: 93 U/L (ref 38–126)
Anion gap: 9 (ref 5–15)
BUN: 13 mg/dL (ref 6–20)
CO2: 24 mmol/L (ref 22–32)
Calcium: 9.7 mg/dL (ref 8.9–10.3)
Chloride: 103 mmol/L (ref 98–111)
Creatinine, Ser: 0.6 mg/dL (ref 0.44–1.00)
GFR, Estimated: >60 mL/min (ref >60)
Glucose, Bld: 95 mg/dL (ref 70–99)
Potassium: 3.9 mmol/L (ref 3.5–5.1)
Sodium: 136 mmol/L (ref 135–145)
Total Bilirubin: 0.6 mg/dL (ref 0.0–1.2)
Total Protein: 7.4 g/dL (ref 6.5–8.1)

## 2023-05-20 LAB — TROPONIN I (HIGH SENSITIVITY)
Troponin I (High Sensitivity): 3 ng/L (ref <18)
Troponin I (High Sensitivity): 3 ng/L (ref <18)

## 2023-05-20 LAB — LIPASE, BLOOD: Lipase: 29 U/L (ref 11–51)

## 2023-05-20 MED ORDER — IOHEXOL 350 MG/ML SOLN
75.0000 mL | Freq: Once | INTRAVENOUS | Status: AC | PRN
  Administered 2023-05-20: 75 mL via INTRAVENOUS

## 2023-05-20 NOTE — ED Provider Notes (Signed)
Emergency Department Provider Note   I have reviewed the triage vital signs and the nursing notes.   HISTORY  Chief Complaint Chest Pain  Video Spanish interpreter used for this encounter.  HPI Brittany Huber is a 53 y.o. female with PMH of HLD presents to the ED with acute onset central CP radiating to the back.  Pain began this morning without clear provoking factors.  Denies similar pain in the past.  No specific shortness of breath.  Denies abdominal pain but pain was slightly left in her chest and radiating to her back and shoulders.  She felt some tingling/numbness to the lips as well as in the bilateral upper extremities.  She tells me that since arriving in the emergency department undergoing CT scan her pain symptoms have mostly resolved and is no longer having numbness in the arms.  She does have some mild tingling in the bilateral upper and lower lips but that seems to also be improving.    Past Medical History:  Diagnosis Date   Anxiety    controlled with CBD   Mixed hyperlipidemia     Review of Systems  Constitutional: No fever/chills Cardiovascular: Positive chest pain. Respiratory: Denies shortness of breath. Gastrointestinal: No abdominal pain.  No nausea, no vomiting.  No diarrhea.  Musculoskeletal: Negative for back pain. Skin: Negative for rash. Neurological: Negative for headaches, focal weakness. Positive bilateral upper extremity numbness.   ____________________________________________   PHYSICAL EXAM:  VITAL SIGNS: ED Triage Vitals [05/20/23 1303]  Encounter Vitals Group     BP (!) 123/57     Pulse Rate 63     Resp 16     Temp 98.2 F (36.8 C)     Temp src      SpO2 100 %   Constitutional: Alert and oriented. Well appearing and in no acute distress. Eyes: Conjunctivae are normal.  Head: Atraumatic. Nose: No congestion/rhinnorhea. Mouth/Throat: Mucous membranes are moist. Neck: No stridor.  Cardiovascular: Normal rate, regular  rhythm. Good peripheral circulation. Grossly normal heart sounds. Equal pulses in the bilateral upper extremities.  Respiratory: Normal respiratory effort.  No retractions. Lungs CTAB. Gastrointestinal: Soft and nontender. No distention.  Musculoskeletal: No lower extremity tenderness nor edema. No gross deformities of extremities. Neurologic:  Normal speech and language. No gross focal neurologic deficits are appreciated. 5/5 strength in the bilateral upper and lower extremities.  Skin:  Skin is warm, dry and intact. No rash noted.  ____________________________________________   LABS (all labs ordered are listed, but only abnormal results are displayed)  Labs Reviewed  URINALYSIS, ROUTINE W REFLEX MICROSCOPIC - Abnormal; Notable for the following components:      Result Value   Color, Urine STRAW (*)    Hgb urine dipstick SMALL (*)    All other components within normal limits  CBC WITH DIFFERENTIAL/PLATELET  COMPREHENSIVE METABOLIC PANEL  LIPASE, BLOOD  I-STAT CHEM 8, ED  TROPONIN I (HIGH SENSITIVITY)  TROPONIN I (HIGH SENSITIVITY)   ____________________________________________  EKG   EKG Interpretation Date/Time:  Thursday May 20 2023 13:05:32 EST Ventricular Rate:  59 PR Interval:  150 QRS Duration:  78 QT Interval:  394 QTC Calculation: 390 R Axis:   24  Text Interpretation: Sinus bradycardia Low voltage QRS Cannot rule out Anterior infarct , age undetermined Abnormal ECG No previous ECGs available Confirmed by Alona Bene [phone]) on 05/20/2023 1:42:09 PM        ____________________________________________  RADIOLOGY  DG Chest Portable 1 View Result Date: 05/20/2023 CLINICAL  DATA:  Chest pain. EXAM: PORTABLE CHEST 1 VIEW COMPARISON:  None Available. FINDINGS: Bilateral lung fields are clear. Bilateral costophrenic angles are clear. Normal cardio-mediastinal silhouette. No acute osseous abnormalities. The soft tissues are within normal limits. IMPRESSION: No  active disease. Electronically Signed   By: Jules Schick M.D.   On: 05/20/2023 14:40   CT Angio Chest/Abd/Pel for Dissection W and/or Wo Contrast Result Date: 05/20/2023 CLINICAL DATA:  Acute aortic syndrome (AAS) suspected. Chest and back pain. EXAM: CT ANGIOGRAPHY CHEST, ABDOMEN AND PELVIS TECHNIQUE: Non-contrast CT of the chest was initially obtained. Multidetector CT imaging through the chest, abdomen and pelvis was performed using the standard protocol during bolus administration of intravenous contrast. Multiplanar reconstructed images and MIPs were obtained and reviewed to evaluate the vascular anatomy. RADIATION DOSE REDUCTION: This exam was performed according to the departmental dose-optimization program which includes automated exposure control, adjustment of the mA and/or kV according to patient size and/or use of iterative reconstruction technique. CONTRAST:  75mL OMNIPAQUE IOHEXOL 350 MG/ML SOLN COMPARISON:  None Available. FINDINGS: CTA CHEST FINDINGS Cardiovascular: No intramural hematoma noted in the thoracic aorta on the unenhanced images. Thoracic aorta is normal in caliber without aneurysm, dissection, vasculitis or significant stenosis. Normal cardiac size. no pericardial effusion. No aortic aneurysm. Mediastinum/Nodes: Visualized thyroid gland appears grossly unremarkable. No solid / cystic mediastinal masses. The esophagus is nondistended precluding optimal assessment. No axillary, mediastinal or hilar lymphadenopathy by size criteria. Lungs/Pleura: The central tracheo-bronchial tree is patent. No mass or consolidation. No pleural effusion or pneumothorax. No suspicious lung nodules. Musculoskeletal: The visualized soft tissues of the chest wall are grossly unremarkable. No suspicious osseous lesions. Review of the MIP images confirms the above findings. CTA ABDOMEN AND PELVIS FINDINGS VASCULAR Aorta: Normal caliber aorta without aneurysm, dissection, vasculitis or significant stenosis.  Celiac: Patent without evidence of aneurysm, dissection, vasculitis or significant stenosis. SMA: At the origin, there is a short length moderate smooth narrowing of the artery due to soft tissue attenuation areas which are within/adjacent to the lumen. This is nonspecific and differential diagnosis includes inflammation/fluid surrounding the artery due to vasculitis, noncalcified plaque or intraluminal thrombus. Renals: Both renal arteries are patent without evidence of aneurysm, dissection, vasculitis, fibromuscular dysplasia or significant stenosis. IMA: Patent without evidence of aneurysm, dissection, vasculitis or significant stenosis. Inflow: Patent without evidence of aneurysm, dissection, vasculitis or significant stenosis. Veins: No obvious venous abnormality within the limitations of this arterial phase study. Review of the MIP images confirms the above findings. NON-VASCULAR Hepatobiliary: The liver is normal in size. Non-cirrhotic configuration. No suspicious mass. No intrahepatic or extrahepatic bile duct dilation. No calcified gallstones. Normal gallbladder wall thickness. No pericholecystic inflammatory changes. Pancreas: Unremarkable. No pancreatic ductal dilatation or surrounding inflammatory changes. Spleen: Within normal limits. No focal lesion. Adrenals/Urinary Tract: Adrenal glands are unremarkable. No suspicious renal mass. No hydronephrosis. No renal or ureteric calculi. Unremarkable urinary bladder. Stomach/Bowel: No disproportionate dilation of the small or large bowel loops. No evidence of abnormal bowel wall thickening or inflammatory changes. The appendix is unremarkable. Vascular/Lymphatic: No ascites or pneumoperitoneum. No abdominal or pelvic lymphadenopathy, by size criteria. No aneurysmal dilation of the major abdominal arteries. Reproductive: The uterus is unremarkable. No large adnexal mass. Other: There is a tiny fat containing umbilical hernia. The soft tissues and abdominal wall  are otherwise unremarkable. Musculoskeletal: No suspicious osseous lesions. Review of the MIP images confirms the above findings. IMPRESSION: 1. No acute aortic syndrome. No intramural hematoma in thoracic aorta. No aneurysmal dilation,  dissection or penetrating atherosclerotic ulcer in the thoracoabdominal aorta. 2. 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 3. Multiple other nonacute observations, as described above. Electronically Signed   By: Jules Schick M.D.   On: 05/20/2023 14:39    ____________________________________________   PROCEDURES  Procedure(s) performed:   Procedures  None  ____________________________________________   INITIAL IMPRESSION / ASSESSMENT AND PLAN / ED COURSE  Pertinent labs & imaging results that were available during my care of the patient were reviewed by me and considered in my medical decision making (see chart for details).   This patient is Presenting for Evaluation of CP, which does require a range of treatment options, and is a complaint that involves a high risk of morbidity and mortality.  The Differential Diagnoses includes but is not exclusive to acute coronary syndrome, aortic dissection, pulmonary embolism, cardiac tamponade, community-acquired pneumonia, pericarditis, musculoskeletal chest wall pain, etc.   Critical Interventions-    Medications  iohexol (OMNIPAQUE) 350 MG/ML injection 75 mL (75 mLs Intravenous Contrast Given 05/20/23 1356)    Reassessment after intervention:  pain improved.    I did obtain Additional Historical Information from family at bedside.   Clinical Laboratory Tests Ordered, included CBC without leukocytosis or anemia.  Lipase, LFTs, bilirubin normal.  Troponin normal.  Radiologic Tests Ordered, included CTA dissection. I independently interpreted the images and agree with  radiology interpretation.   Cardiac Monitor Tracing which shows NSR.    Social Determinants of Health Risk patient is a non-smoker.   Medical Decision Making: Summary:  Patient arrives directly from triage as a CODE MEDICAL with CTA dissection and labs ordered from triage.   Reevaluation with update and discussion with patient. Pain has resolved. Considering the SMA incidental findings I doubt this is causing symptoms today. No post-prandial pain or tenderness in the abdomen on exam. No subjective abdominal pain.   Considered admission but likely discharge with normal CTA and normal troponin is troponin remains normal on re-check. Care transferred to oncoming team.   Patient's presentation is most consistent with acute presentation with potential threat to life or bodily function.   Disposition: pending  ____________________________________________  FINAL CLINICAL IMPRESSION(S) / ED DIAGNOSES  Final diagnoses:  Atypical chest pain    Note:  This document was prepared using Dragon voice recognition software and may include unintentional dictation errors.  Alona Bene, MD, [facility]    Ellissa Ayo, Arlyss Repress, MD 05/21/23 [phone]

## 2023-05-20 NOTE — ED Provider Notes (Signed)
   ED Course / MDM   Clinical Course as of 05/20/23 1852  Thu May 20, 2023  1539 Received sign out from Dr. Jacqulyn Bath, initially presenting with chest pain, had CXR, labs, dissection study, negative for acute process. Pending 2nd trop and re-assessment.  [WS]  1851 Repeat troponin negative, urinalysis clear.  She does report that chest pain was in the setting of anxiety, suspect this is probably the most likely cause.  She denies any symptoms at this time.  Will place cardiology referral given age. Will discharge patient to home. All questions answered. Patient comfortable with plan of discharge. Return precautions discussed with patient and specified on the after visit summary. Spanish interpreter used [WS]    Clinical Course User Index [WS] Lonell Grandchild, MD   Medical Decision Making Amount and/or Complexity of Data Reviewed Radiology: ordered.         Lonell Grandchild, MD 05/20/23 [phone]

## 2023-05-20 NOTE — ED Triage Notes (Addendum)
Pt reports "my heart isn't working right" and my chest and back are hurting. Pt reports the chest pain that radiates to her back that started this morning. Pt also reports bilateral arm tingling. PT stating that she is having episodes of feeling "like I am going to die."

## 2023-05-20 NOTE — Discharge Instructions (Signed)
We evaluated you for your chest pain.  Your testing in the emergency department is reassuring.  We did not see any dangerous cause of your chest pain here.  It could be anxiety related, but we would still like you to follow-up with a cardiologist.  We have placed a cardiology referral.  They should call you for follow-up.  Please return to the emergency department if you have any new or worsening symptoms such as severe pain, lightheadedness or dizziness, fainting, difficulty breathing, or any other concerning symptoms.  Evaluamos su dolor en el pecho. [employer] le hicieron en el departamento de emergencias son tranquilizadoras. No vimos ninguna causa peligrosa para su dolor en el pecho. Podra estar relacionado con la ansiedad, pero aun as nos gustara que hiciera un seguimiento con un cardilogo. Hemos enviado una derivacin a Medical illustrator. Deberan llamarlo para un seguimiento.  Por favor, regrese al departamento de emergencias si tiene sntomas nuevos o que empeoran, como dolor intenso, [place], [place], dificultad para respirar o cualquier otro sntoma preocupante.

## 2023-05-20 NOTE — ED Provider Triage Note (Signed)
Emergency Medicine Provider Triage Evaluation Note  Brittany Huber , a 53 y.o. female  was evaluated in triage.  Pt complains of sudden onset chest pain he radiating to her upper back and the back of her neck with associated weakness of both upper extremities numbness and tingling.  She denies shortness of breath but feels weak all over and mildly nauseous.  History of hypercholesterolemia and does not take medications but denies any history of hypertension or smoking..  Patient stated twice "I feel like I am going to die."  Language barrier, translation services utilized.  Review of Systems  Positive: Chest pain Negative: vomiting  Physical Exam  BP (!) 123/57 (BP Location: Right Arm)   Pulse 63   Temp 98.2 F (36.8 C)   Resp 16   LMP 02/04/2016   SpO2 100%  Gen:   Awake, no distress  F2 Resp:  Normal effort F2 MSK:   Moves extremities without difficulty F2 Other:    Medical Decision Making  Medically screening exam initiated at 1:36 PM.  Appropriate orders placed.  Brittany Huber was informed that the remainder of the evaluation will be completed by another provider, this initial triage assessment does not replace that evaluation, and the importance of remaining in the ED until their evaluation is complete.  Patient activated as a code medical to rule out dissection.  She is well-appearing however with language barriers and other social determinants of health that make it a barrier she needs immediate workup to rule out life-threatening emergency.  Case discussed with Dr. Derwood Kaplan, [place], PA-C 05/20/23 1357

## 2023-05-20 NOTE — ED Notes (Signed)
Pt taken to CT from triage by this RN, primary RN to meet pt in CT for handoff

## 2023-05-24 NOTE — Progress Notes (Signed)
CARDIOLOGY CONSULT NOTE       Patient ID: Brittany Huber MRN: [id number] DOB/AGE: 1970-07-10 53 y.o.   Referring Physician: Caryn Bee ED Primary Physician: Pcp, No Primary Cardiologist: New Prior Marguerita Beards  Reason for Consultation: Chest pain    HPI:  53 y.o. referred by Dr Caryn Bee ED for chest pain. History of Anxiety and HLD. Seen in [facility] ED 05/20/23 Had sudden onset of chest pain radiating to back and neck associated with weakness in both upper extremities along with numbness and tingling. Had mild nausea. No dyspnea. CTA of the chest/abdomen/pelvis showed no acute aortic pathology or aneurysm. Seen by Dr Bjorn Pippin 01/17/19 for chest pain. Cardiac CTA done 01/30/19 showed calcium score of 0 and normal right dominant coronary arteries Labs in ER including lipase negative Normal troponin x 2.  She is on lipitor for her HLD No recent labs  She's from [place]. She has been her since 2000 Works at a [employer] Two older kids out of house and one 59 yo daughter at the home.   Pain is central does not seem to be related to food, She did have GERD and H pylori in past No history of GB dx She complained of SSC while I was in room ECG was totally normal    ROS All other systems reviewed and negative except as noted above  Past Medical History:  Diagnosis Date   Anxiety    controlled with CBD   Mixed hyperlipidemia     Family History  Problem Relation Age of Onset   Hypertension Mother    Breast cancer Maternal Grandmother    Cancer Maternal Grandmother        uterine cancer   Colon cancer Neg Hx    Esophageal cancer Neg Hx    Stomach cancer Neg Hx    Rectal cancer Neg Hx     Social History   Socioeconomic History   Marital status: Single    Spouse name: Not on file   Number of children: 3   Years of education: Not on file   Highest education level: 6th grade  Occupational History   Not on file  Tobacco Use   Smoking status: Never   Smokeless  tobacco: Never  Vaping Use   Vaping status: Never Used  Substance and Sexual Activity   Alcohol use: No   Drug use: No   Sexual activity: Yes    Birth control/protection: None, Condom, Post-menopausal  Other Topics Concern   Not on file  Social History Narrative   Not on file   Social Drivers of Health   Financial Resource Strain: Not on file  Food Insecurity: No Food Insecurity (11/20/2021)   Hunger Vital Sign    Worried About Running Out of Food in the Last Year: Never true    Ran Out of Food in the Last Year: Never true  Transportation Needs: No Transportation Needs (11/20/2021)   PRAPARE - Administrator, Civil Service (Medical): No    Lack of Transportation (Non-Medical): No  Physical Activity: Inactive (04/15/2017)   Exercise Vital Sign    Days of Exercise per Week: 0 days    Minutes of Exercise per Session: 0 min  Stress: No Stress Concern Present (04/15/2017)   [employer] of Occupational Health - Occupational Stress Questionnaire    Feeling of Stress : Only a little  Social Connections: Somewhat Isolated (04/15/2017)   Social Connection and Isolation Panel [NHANES]    Frequency of  Communication with Friends and Family: Twice a week    Frequency of Social Gatherings with Friends and Family: Never    Attends Religious Services: More than 4 times per year    Active Member of [employer] or Organizations: No    Attends Banker Meetings: Never    Marital Status: Living with partner  Intimate Partner Violence: Not At Risk (04/15/2017)   Humiliation, Afraid, Rape, and Kick questionnaire    Fear of Current or Ex-Partner: No    Emotionally Abused: No    Physically Abused: No    Sexually Abused: No    Past Surgical History:  Procedure Laterality Date   CERVICAL BIOPSY  W/ LOOP ELECTRODE EXCISION     COLPOSCOPY        Current Outpatient Medications:    atorvastatin (LIPITOR) 20 MG tablet, Take 1 tablet (20 mg total) by mouth daily., Disp: 90  tablet, Rfl: 1   ferrous sulfate 325 (65 FE) MG tablet, Take 325 mg by mouth daily with breakfast. (Patient not taking: Reported on 05/28/2023), Disp: , Rfl:    KRILL OIL PO, Take 500 mg by mouth daily. (Patient not taking: Reported on 05/28/2023), Disp: , Rfl:    Loratadine (CLARITIN) 10 MG CAPS, Take 10 mg by mouth daily as needed. (Patient not taking: Reported on 05/28/2023), Disp: , Rfl:    omeprazole (PRILOSEC) 40 MG capsule, Take one 30 minutes before meals for gastritis x 8 weeks (Patient not taking: Reported on 05/28/2023), Disp: 90 capsule, Rfl: 0    Physical Exam: Blood pressure (!) 102/58, pulse 67, height 4\' 11"  (1.499 m), weight 138 lb 6.4 oz (62.8 kg), last menstrual period 02/04/2016, SpO2 97%.    Affect appropriate Healthy:  appears stated age HEENT: normal Neck supple with no adenopathy JVP normal no bruits no thyromegaly Lungs clear with no wheezing and good diaphragmatic motion Heart:  S1/S2 no murmur, no rub, gallop or click PMI normal Abdomen: benighn, BS positve, no tenderness, no AAA no bruit.  No HSM or HJR Distal pulses intact with no bruits No edema Neuro non-focal Skin warm and dry No muscular weakness   Labs:   Lab Results  Component Value Date   WBC 7.0 05/20/2023   HGB 15.0 05/20/2023   HCT 44.0 05/20/2023   MCV 91.2 05/20/2023   PLT 280 05/20/2023    No results for input(s): "NA", "K", "CL", "CO2", "BUN", "CREATININE", "CALCIUM", "PROT", "BILITOT", "ALKPHOS", "ALT", "AST", "GLUCOSE" in the last 168 hours.  Invalid input(s): "LABALBU"  No results found for: "CKTOTAL", "CKMB", "CKMBINDEX", "TROPONINI"  Lab Results  Component Value Date   CHOL 153 08/24/2019   CHOL 303 (H) 06/21/2019   CHOL 290 (H) 12/22/2018   Lab Results  Component Value Date   HDL 60 08/24/2019   HDL 60 06/21/2019   HDL 54 12/22/2018   Lab Results  Component Value Date   LDLCALC 76 08/24/2019   LDLCALC 202 (H) 06/21/2019   LDLCALC 178 (H) 12/22/2018   Lab Results   Component Value Date   TRIG 91 08/24/2019   TRIG 213 (H) 06/21/2019   TRIG 290 (H) 12/22/2018   Lab Results  Component Value Date   CHOLHDL 2.6 08/24/2019   CHOLHDL 5.1 (H) 06/21/2019   CHOLHDL 5.4 (H) 12/22/2018   No results found for: "LDLDIRECT"    Radiology: DG Chest Portable 1 View Result Date: 05/20/2023 CLINICAL DATA:  Chest pain. EXAM: PORTABLE CHEST 1 VIEW COMPARISON:  None Available. FINDINGS: Bilateral lung fields  are clear. Bilateral costophrenic angles are clear. Normal cardio-mediastinal silhouette. No acute osseous abnormalities. The soft tissues are within normal limits. IMPRESSION: No active disease. Electronically Signed   By: Jules Schick M.D.   On: 05/20/2023 14:40   CT Angio Chest/Abd/Pel for Dissection W and/or Wo Contrast Result Date: 05/20/2023 CLINICAL DATA:  Acute aortic syndrome (AAS) suspected. Chest and back pain. EXAM: CT ANGIOGRAPHY CHEST, ABDOMEN AND PELVIS TECHNIQUE: Non-contrast CT of the chest was initially obtained. Multidetector CT imaging through the chest, abdomen and pelvis was performed using the standard protocol during bolus administration of intravenous contrast. Multiplanar reconstructed images and MIPs were obtained and reviewed to evaluate the vascular anatomy. RADIATION DOSE REDUCTION: This exam was performed according to the departmental dose-optimization program which includes automated exposure control, adjustment of the mA and/or kV according to patient size and/or use of iterative reconstruction technique. CONTRAST:  75mL OMNIPAQUE IOHEXOL 350 MG/ML SOLN COMPARISON:  None Available. FINDINGS: CTA CHEST FINDINGS Cardiovascular: No intramural hematoma noted in the thoracic aorta on the unenhanced images. Thoracic aorta is normal in caliber without aneurysm, dissection, vasculitis or significant stenosis. Normal cardiac size. no pericardial effusion. No aortic aneurysm. Mediastinum/Nodes: Visualized thyroid gland appears grossly unremarkable. No  solid / cystic mediastinal masses. The esophagus is nondistended precluding optimal assessment. No axillary, mediastinal or hilar lymphadenopathy by size criteria. Lungs/Pleura: The central tracheo-bronchial tree is patent. No mass or consolidation. No pleural effusion or pneumothorax. No suspicious lung nodules. Musculoskeletal: The visualized soft tissues of the chest wall are grossly unremarkable. No suspicious osseous lesions. Review of the MIP images confirms the above findings. CTA ABDOMEN AND PELVIS FINDINGS VASCULAR Aorta: Normal caliber aorta without aneurysm, dissection, vasculitis or significant stenosis. Celiac: Patent without evidence of aneurysm, dissection, vasculitis or significant stenosis. SMA: At the origin, there is a short length moderate smooth narrowing of the artery due to soft tissue attenuation areas which are within/adjacent to the lumen. This is nonspecific and differential diagnosis includes inflammation/fluid surrounding the artery due to vasculitis, noncalcified plaque or intraluminal thrombus. Renals: Both renal arteries are patent without evidence of aneurysm, dissection, vasculitis, fibromuscular dysplasia or significant stenosis. IMA: Patent without evidence of aneurysm, dissection, vasculitis or significant stenosis. Inflow: Patent without evidence of aneurysm, dissection, vasculitis or significant stenosis. Veins: No obvious venous abnormality within the limitations of this arterial phase study. Review of the MIP images confirms the above findings. NON-VASCULAR Hepatobiliary: The liver is normal in size. Non-cirrhotic configuration. No suspicious mass. No intrahepatic or extrahepatic bile duct dilation. No calcified gallstones. Normal gallbladder wall thickness. No pericholecystic inflammatory changes. Pancreas: Unremarkable. No pancreatic ductal dilatation or surrounding inflammatory changes. Spleen: Within normal limits. No focal lesion. Adrenals/Urinary Tract: Adrenal glands  are unremarkable. No suspicious renal mass. No hydronephrosis. No renal or ureteric calculi. Unremarkable urinary bladder. Stomach/Bowel: No disproportionate dilation of the small or large bowel loops. No evidence of abnormal bowel wall thickening or inflammatory changes. The appendix is unremarkable. Vascular/Lymphatic: No ascites or pneumoperitoneum. No abdominal or pelvic lymphadenopathy, by size criteria. No aneurysmal dilation of the major abdominal arteries. Reproductive: The uterus is unremarkable. No large adnexal mass. Other: There is a tiny fat containing umbilical hernia. The soft tissues and abdominal wall are otherwise unremarkable. Musculoskeletal: No suspicious osseous lesions. Review of the MIP images confirms the above findings. IMPRESSION: 1. No acute aortic syndrome. No intramural hematoma in thoracic aorta. No aneurysmal dilation, dissection or penetrating atherosclerotic ulcer in the thoracoabdominal aorta. 2. 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 3. Multiple other nonacute observations, as described above. Electronically Signed   By: Jules Schick M.D.   On: 05/20/2023 14:39    EKG: SR rate 59 normal 05/21/23    ASSESSMENT AND PLAN:   Chest Pain: Evaluated in ER 05/20/23 with normal ECG, negative troponin x 2. And CTA with no acute aortic pathology. Prior cardiac CTA 01/2019 with calcium score 0 and normal right dominant coronary arteries no anomalies Given recurrent pain and ER visit will repeat cardiac CTA since it has been over 5 years.  HLD:  continue lipitor check lipid/liver  Lopressor 50 mg 2 hours prior to CTA Cardiac CTA  F/U PRN pending testing   Signed: Charlton Haws 05/28/2023, 3:42 PM

## 2023-05-28 ENCOUNTER — Ambulatory Visit
Payer: No Typology Code available for payment source | Attending: Cardiovascular Disease | Admitting: Cardiovascular Disease

## 2023-05-28 VITALS — BP 102/58 | HR 67 | Ht 59.0 in | Wt 138.4 lb

## 2023-05-28 DIAGNOSIS — R0789 Other chest pain: Secondary | ICD-10-CM

## 2023-05-28 DIAGNOSIS — R072 Precordial pain: Secondary | ICD-10-CM

## 2023-05-28 MED ORDER — METOPROLOL TARTRATE 50 MG PO TABS: 50.0000 mg | ORAL_TABLET | Freq: Once | ORAL | 0 refills | Status: AC

## 2023-05-28 NOTE — Patient Instructions (Addendum)
Medication Instructions:  Your physician recommends that you continue on your current medications as directed. Please refer to the Current Medication list given to you today.  *If you need a refill on your cardiac medications before your next appointment, please call your pharmacy*  Lab Work: If you have labs (blood work) drawn today and your tests are completely normal, you will receive your results only by: MyChart Message (if you have MyChart) OR A paper copy in the mail If you have any lab test that is abnormal or we need to change your treatment, we will call you to review the results.  Testing/Procedures: Your physician has requested that you have cardiac CT. Cardiac computed tomography (CT) is a painless test that uses an x-ray machine to take clear, detailed pictures of your heart. For further information please visit [email or web address] Please follow instruction sheet as given.  Follow-Up: At [facility], you and your health needs are our priority.  As part of our continuing mission to provide you with exceptional heart care, we have created designated Provider Care Teams.  These Care Teams include your primary Cardiologist (physician) and Advanced Practice Providers (APPs -  Physician Assistants and Nurse Practitioners) who all work together to provide you with the care you need, when you need it.  We recommend signing up for the patient portal called "MyChart".  Sign up information is provided on this After Visit Summary.  MyChart is used to connect with patients for Virtual Visits (Telemedicine).  Patients are able to view lab/test results, encounter notes, upcoming appointments, etc.  Non-urgent messages can be sent to your provider as well.   To learn more about what you can do with MyChart, go to [email or web address].    Your next appointment:   As needed  Provider:   Charlton Haws, MD     Other Instructions    Your cardiac CT will be scheduled at one of the  below locations:   [facility] [address] [place], [place] [address] [phone]  OR  [facility] [address] [place], [place] [address] [phone]  OR   [facility] [address] [place], [place] [address] [phone]  OR   [facility] [address] [place], [place] [address] [phone]  If scheduled at [facility], please arrive at the [facility] (Entrance C2) of [facility] 30 minutes prior to test start time. You can use the FREE valet parking offered at entrance C (encouraged to control the heart rate for the test)  Proceed to the [facility] Radiology Department (first floor) to check-in and test prep.  All radiology patients and guests should use entrance C2 at [facility], accessed from [facility], even though the hospital's physical address listed is [address]    If scheduled at [facility] or [facility], please arrive 15 mins early for check-in and test prep.  There is spacious parking and easy access to the radiology department from the [facility] entrance. Please enter here and check-in with the desk attendant.   If scheduled at [facility], please arrive 30 minutes early for check-in and test prep.  Please follow these instructions carefully (unless otherwise directed):  An IV will be required for this test and Nitroglycerin will be given.  Hold all erectile dysfunction medications at least 3 days (72 hrs) prior  to test. (Ie viagra, cialis, sildenafil, tadalafil, etc)   On the Night Before the Test: Be sure to Drink plenty of water. Do not consume any caffeinated/decaffeinated beverages or chocolate 12 hours prior to your test. Do not take any antihistamines 12 hours prior to your test.  On the Day of  the Test: Drink plenty of water until 1 hour prior to the test. Do not eat any food 1 hour prior to test. You may take your regular medications prior to the test.  Take metoprolol (Lopressor) 50 mg two hours prior to test. FEMALES- please wear underwire-free bra if available, avoid dresses & tight clothing       After the Test: Drink plenty of water. After receiving IV contrast, you may experience a mild flushed feeling. This is normal. On occasion, you may experience a mild rash up to 24 hours after the test. This is not dangerous. If this occurs, you can take Benadryl 25 mg, Zyrtec, Claritin, or Allegra and increase your fluid intake. (Patients taking Tikosyn should avoid Benadryl, and may take Zyrtec, Claritin, or Allegra) If you experience trouble breathing, this can be serious. If it is severe call 911 IMMEDIATELY. If it is mild, please call our office.  We will call to schedule your test 2-4 weeks out understanding that some insurance companies will need an authorization prior to the service being performed.   For more information and frequently asked questions, please visit our website : [email or web address]  For non-scheduling related questions, please contact the cardiac imaging nurse navigator should you have any questions/concerns: Cardiac Imaging Nurse Navigators Direct Office Dial: [phone]   For scheduling needs, including cancellations and rescheduling, please call [place], [phone].
